# Patient Record
Sex: Female | Born: 1950
Health system: Southern US, Community
[De-identification: ages and names within clinical notes are randomized; demographics above are authoritative.]

## PROBLEM LIST (undated history)

## (undated) DIAGNOSIS — E785 Hyperlipidemia, unspecified: Secondary | ICD-10-CM

## (undated) DIAGNOSIS — M1990 Unspecified osteoarthritis, unspecified site: Secondary | ICD-10-CM

## (undated) DIAGNOSIS — I10 Essential (primary) hypertension: Secondary | ICD-10-CM

## (undated) DIAGNOSIS — T7840XA Allergy, unspecified, initial encounter: Secondary | ICD-10-CM

## (undated) DIAGNOSIS — R011 Cardiac murmur, unspecified: Secondary | ICD-10-CM

## (undated) HISTORY — PX: WISDOM TOOTH EXTRACTION: SHX21

## (undated) HISTORY — DX: Allergy, unspecified, initial encounter: T78.40XA

## (undated) HISTORY — DX: Hyperlipidemia, unspecified: E78.5

## (undated) HISTORY — DX: Essential (primary) hypertension: I10

## (undated) HISTORY — DX: Unspecified osteoarthritis, unspecified site: M19.90

---

## 1998-11-30 ENCOUNTER — Other Ambulatory Visit: Admission: RE | Admit: 1998-11-30 | Discharge: 1998-11-30 | Payer: Self-pay | Admitting: Obstetrics and Gynecology

## 1999-12-07 ENCOUNTER — Other Ambulatory Visit: Admission: RE | Admit: 1999-12-07 | Discharge: 1999-12-07 | Payer: Self-pay | Admitting: *Deleted

## 1999-12-26 ENCOUNTER — Encounter: Payer: Self-pay | Admitting: *Deleted

## 1999-12-26 ENCOUNTER — Encounter: Admission: RE | Admit: 1999-12-26 | Discharge: 1999-12-26 | Payer: Self-pay | Admitting: *Deleted

## 2000-12-12 ENCOUNTER — Other Ambulatory Visit: Admission: RE | Admit: 2000-12-12 | Discharge: 2000-12-12 | Payer: Self-pay | Admitting: *Deleted

## 2001-01-04 ENCOUNTER — Encounter: Admission: RE | Admit: 2001-01-04 | Discharge: 2001-01-04 | Payer: Self-pay | Admitting: *Deleted

## 2001-01-04 ENCOUNTER — Encounter: Payer: Self-pay | Admitting: *Deleted

## 2001-11-20 ENCOUNTER — Other Ambulatory Visit: Admission: RE | Admit: 2001-11-20 | Discharge: 2001-11-20 | Payer: Self-pay | Admitting: Obstetrics and Gynecology

## 2002-03-07 ENCOUNTER — Encounter: Admission: RE | Admit: 2002-03-07 | Discharge: 2002-03-07 | Payer: Self-pay | Admitting: *Deleted

## 2002-03-07 ENCOUNTER — Encounter: Payer: Self-pay | Admitting: *Deleted

## 2002-11-26 ENCOUNTER — Other Ambulatory Visit: Admission: RE | Admit: 2002-11-26 | Discharge: 2002-11-26 | Payer: Self-pay | Admitting: Obstetrics and Gynecology

## 2003-04-07 ENCOUNTER — Encounter: Admission: RE | Admit: 2003-04-07 | Discharge: 2003-04-07 | Payer: Self-pay | Admitting: *Deleted

## 2004-12-28 ENCOUNTER — Ambulatory Visit (HOSPITAL_COMMUNITY): Admission: RE | Admit: 2004-12-28 | Discharge: 2004-12-28 | Payer: Self-pay | Admitting: Obstetrics and Gynecology

## 2006-08-01 ENCOUNTER — Ambulatory Visit (HOSPITAL_COMMUNITY): Admission: RE | Admit: 2006-08-01 | Discharge: 2006-08-01 | Payer: Self-pay | Admitting: Obstetrics and Gynecology

## 2007-08-27 ENCOUNTER — Ambulatory Visit (HOSPITAL_COMMUNITY): Admission: RE | Admit: 2007-08-27 | Discharge: 2007-08-27 | Payer: Self-pay | Admitting: Obstetrics and Gynecology

## 2007-11-20 LAB — HM COLONOSCOPY

## 2009-03-09 ENCOUNTER — Ambulatory Visit (HOSPITAL_COMMUNITY): Admission: RE | Admit: 2009-03-09 | Discharge: 2009-03-09 | Payer: Self-pay | Admitting: Obstetrics and Gynecology

## 2010-03-15 ENCOUNTER — Ambulatory Visit (HOSPITAL_COMMUNITY): Admission: RE | Admit: 2010-03-15 | Discharge: 2010-03-15 | Payer: Self-pay | Admitting: Obstetrics and Gynecology

## 2010-06-05 ENCOUNTER — Encounter: Payer: Self-pay | Admitting: Obstetrics and Gynecology

## 2010-08-11 LAB — HM PAP SMEAR

## 2010-12-15 ENCOUNTER — Encounter: Payer: Self-pay | Admitting: Family Medicine

## 2010-12-15 DIAGNOSIS — E785 Hyperlipidemia, unspecified: Secondary | ICD-10-CM | POA: Insufficient documentation

## 2012-01-18 ENCOUNTER — Other Ambulatory Visit (HOSPITAL_COMMUNITY): Payer: Self-pay | Admitting: Obstetrics and Gynecology

## 2012-01-18 DIAGNOSIS — Z1231 Encounter for screening mammogram for malignant neoplasm of breast: Secondary | ICD-10-CM

## 2012-01-30 ENCOUNTER — Ambulatory Visit (HOSPITAL_COMMUNITY)
Admission: RE | Admit: 2012-01-30 | Discharge: 2012-01-30 | Disposition: A | Discharge status: Home / Self Care | Payer: BC Managed Care – PPO | Source: Ambulatory Visit | Attending: Obstetrics and Gynecology | Admitting: Obstetrics and Gynecology

## 2012-01-30 DIAGNOSIS — Z1231 Encounter for screening mammogram for malignant neoplasm of breast: Secondary | ICD-10-CM | POA: Insufficient documentation

## 2012-08-30 ENCOUNTER — Other Ambulatory Visit: Payer: Self-pay | Admitting: Family Medicine

## 2012-09-02 NOTE — Telephone Encounter (Signed)
Ok to refill x 3 

## 2012-09-02 NOTE — Telephone Encounter (Signed)
?   OK to Refill  

## 2012-09-02 NOTE — Telephone Encounter (Signed)
Rx Refilled  

## 2012-12-08 ENCOUNTER — Other Ambulatory Visit: Payer: Self-pay | Admitting: Family Medicine

## 2013-03-13 ENCOUNTER — Other Ambulatory Visit: Payer: Self-pay | Admitting: Family Medicine

## 2013-03-13 NOTE — Telephone Encounter (Signed)
Ok x 5 

## 2013-03-13 NOTE — Telephone Encounter (Signed)
Ok to refill 

## 2013-03-26 ENCOUNTER — Ambulatory Visit (INDEPENDENT_AMBULATORY_CARE_PROVIDER_SITE_OTHER): Payer: BC Managed Care – PPO

## 2013-03-26 VITALS — BP 142/84 | HR 77 | Resp 24 | Ht 64.5 in | Wt 148.0 lb

## 2013-03-26 DIAGNOSIS — M21619 Bunion of unspecified foot: Secondary | ICD-10-CM

## 2013-03-26 DIAGNOSIS — M2010 Hallux valgus (acquired), unspecified foot: Secondary | ICD-10-CM

## 2013-03-26 DIAGNOSIS — M2040 Other hammer toe(s) (acquired), unspecified foot: Secondary | ICD-10-CM

## 2013-03-26 DIAGNOSIS — M1990 Unspecified osteoarthritis, unspecified site: Secondary | ICD-10-CM

## 2013-03-26 NOTE — Patient Instructions (Signed)
Bunion You have a bunion deformity of the feet. This is more common in women. It tends to be an inherited problem. Symptoms can include pain, swelling, and deformity around the great toe. Numbness and tingling may also be present. Your symptoms are often worsened by wearing shoes that cause pressure on the bunion. Changing the type of shoes you wear helps reduce symptoms. A wide shoe decreases pressure on the bunion. An arch support may be used if you have flat feet. Avoid shoes with heels higher than two inches. This puts more pressure on the bunion. X-rays may be helpful in evaluating the severity of the problem. Other foot problems often seen with bunions include corns, calluses, and hammer toes. If the deformity or pain is severe, surgical treatment may be necessary. Keep off your painful foot as much as possible until the pain is relieved. Call your caregiver if your symptoms are worse.  SEEK IMMEDIATE MEDICAL CARE IF:  You have increased redness, pain, swelling, or other symptoms of infection. Document Released: 05/01/2005 Document Revised: 07/24/2011 Document Reviewed: 10/29/2006 Exit[employer] Patient Information 2014 [employer]. Osteoarthritis Osteoarthritis is the most common form of arthritis. It is redness, soreness, and swelling (inflammation) affecting the cartilage. Cartilage acts as a cushion, covering the ends of bones where they meet to form a joint. CAUSES  Over time, the cartilage begins to wear away. This causes bone to rub on bone. This produces pain and stiffness in the affected joints. Factors that contribute to this problem are:  Excessive body weight.  Age.  Overuse of joints. SYMPTOMS   People with osteoarthritis usually experience joint pain, swelling, or stiffness.  Over time, the joint may lose its normal shape.  Small deposits of bone (osteophytes) may grow on the edges of the joint.  Bits of bone or cartilage can break off and float inside the joint space. This  may cause more pain and damage.  Osteoarthritis can lead to depression, anxiety, feelings of helplessness, and limitations on daily activities. The most commonly affected joints are in the:  Ends of the fingers.  Thumbs.  Neck.  Lower back.  Knees.  Hips. DIAGNOSIS  Diagnosis is mostly based on your symptoms and exam. Tests may be helpful, including:  X-rays of the affected joint.  A computerized magnetic scan (MRI).  Blood tests to rule out other types of arthritis.  Joint fluid tests. This involves using a needle to draw fluid from the joint and examining the fluid under a microscope. TREATMENT  Goals of treatment are to control pain, improve joint function, maintain a normal body weight, and maintain a healthy lifestyle. Treatment approaches may include:  A prescribed exercise program with rest and joint relief.  Weight control with nutritional education.  Pain relief techniques such as:  Properly applied heat and cold.  Electric pulses delivered to nerve endings under the skin (transcutaneous electrical nerve stimulation, TENS).  Massage.  Certain supplements. Ask your caregiver before using any supplements, especially in combination with prescribed drugs.  Medicines to control pain, such as:  Acetaminophen.  Nonsteroidal anti-inflammatory drugs (NSAIDs), such as naproxen.  Narcotic or central-acting agents, such as tramadol. This drug carries a risk of addiction and is generally prescribed for short-term use.  Corticosteroids. These can be given orally or as injection. This is a short-term treatment, not recommended for routine use.  Surgery to reposition the bones and relieve pain (osteotomy) or to remove loose pieces of bone and cartilage. Joint replacement may be needed in advanced states   of osteoarthritis. HOME CARE INSTRUCTIONS  Your caregiver can recommend specific types of exercise. These may include:  Strengthening exercises. These are done to  strengthen the muscles that support joints affected by arthritis. They can be performed with weights or with exercise bands to add resistance.  Aerobic activities. These are exercises, such as brisk walking or low-impact aerobics, that get your heart pumping. They can help keep your lungs and circulatory system in shape.  Range-of-motion activities. These keep your joints limber.  Balance and agility exercises. These help you maintain daily living skills. Learning about your condition and being actively involved in your care will help improve the course of your osteoarthritis. SEEK MEDICAL CARE IF:   You feel hot or your skin turns red.  You develop a rash in addition to your joint pain.  You have an oral temperature above 102 F (38.9 C). FOR MORE INFORMATION  National Institute of Arthritis and Musculoskeletal and Skin Diseases: www.niams.nih.gov National Institute on Aging: www.nia.nih.gov American College of Rheumatology: www.rheumatology.org Document Released: 05/01/2005 Document Revised: 07/24/2011 Document Reviewed: 08/12/2009 Exit[employer] Patient Information 2014 [employer].  

## 2013-03-26 NOTE — Progress Notes (Signed)
  Subjective:    Patient ID: Amy Dennis, female    DOB: 08-Aug-1950, 62 y.o.   MRN: [id number] "I want to see what he says about my Bunions."  Foot Pain This is a new problem. The current episode started more than 1 year ago. The problem occurs intermittently. The problem has been gradually worsening (right foot is worst). The symptoms are aggravated by walking (leather shoes, get up on tippy toes). She has tried NSAIDs for the symptoms. The treatment provided moderate relief.      Review of Systems  Constitutional: Negative.   HENT: Positive for sinus pressure.   Eyes: Positive for redness.  Respiratory: Negative.   Cardiovascular: Negative.   Gastrointestinal: Negative.   Endocrine: Negative.   Genitourinary: Negative.   Musculoskeletal: Negative.   Skin: Negative.   Allergic/Immunologic: Negative.   Neurological: Negative.   Hematological: Negative.   Psychiatric/Behavioral: Positive for sleep disturbance.       Objective:   Physical Exam Neurovascular status is intact with pedal pulses palpable DP and PT posterior were for bilateral. Capillary refill time 3 seconds all digits. Skin temperature warm turgor normal no edema rubor pallor or varicosities noted. Neurologically epicritic and proprioceptive sensations intact and symmetric bilateral. Normal plantar response and DTRs are noted. 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 On palpation there is hypertrophy of the second MTP joint with arthrosis of the MTP joints first and second being noted bilateral. X-rays reviewed reveal significant elevated I am angles as well as hallux abductus angle and arthrosis of the first and second MTP joint with asymmetric joint space narrowing and likely cartilage erosions.       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 Patient also has significant hammertoe deformity with degenerative arthritic changes of the second and first MTP joints being noted clinically and radiographically. Patient is a good candidate for surgical intervention however currently caring for her elderly parent. Patient is given literature about bunion and hammertoe correction and will reappoint with the next several months likely early in 2015 for possible surgical consultation involving bunion and hammertoe correction and possibly cheilectomy of the MTP joint second. In the interim patient was given recommendations for wide accommodative shoes with a firm or stable last avoid flimsy shoes, avoid walking barefoot suggested NSAID therapy as needed for pain, over-the-counter pads and cushions may be beneficial as well. Again followup with in the next 2 months for possible surgical consultation  Alvan Dame DPM

## 2013-03-27 ENCOUNTER — Ambulatory Visit (INDEPENDENT_AMBULATORY_CARE_PROVIDER_SITE_OTHER): Payer: BC Managed Care – PPO | Admitting: *Deleted

## 2013-03-27 DIAGNOSIS — Z23 Encounter for immunization: Secondary | ICD-10-CM

## 2013-04-13 ENCOUNTER — Other Ambulatory Visit: Payer: Self-pay | Admitting: Family Medicine

## 2013-04-30 ENCOUNTER — Other Ambulatory Visit (HOSPITAL_COMMUNITY): Payer: Self-pay | Admitting: Obstetrics and Gynecology

## 2013-04-30 DIAGNOSIS — Z1231 Encounter for screening mammogram for malignant neoplasm of breast: Secondary | ICD-10-CM

## 2013-05-16 ENCOUNTER — Ambulatory Visit (HOSPITAL_COMMUNITY)
Admission: RE | Admit: 2013-05-16 | Discharge: 2013-05-16 | Disposition: A | Discharge status: Home / Self Care | Payer: BC Managed Care – PPO | Source: Ambulatory Visit | Attending: Obstetrics and Gynecology | Admitting: Obstetrics and Gynecology

## 2013-05-16 DIAGNOSIS — Z1231 Encounter for screening mammogram for malignant neoplasm of breast: Secondary | ICD-10-CM | POA: Insufficient documentation

## 2013-07-16 ENCOUNTER — Other Ambulatory Visit: Payer: Self-pay | Admitting: Family Medicine

## 2013-07-16 MED ORDER — SIMVASTATIN 40 MG PO TABS: 40.0000 mg | ORAL_TABLET | Freq: Every day | ORAL | Status: DC

## 2013-07-16 NOTE — Telephone Encounter (Signed)
Rx Refilled  

## 2013-08-19 ENCOUNTER — Ambulatory Visit (INDEPENDENT_AMBULATORY_CARE_PROVIDER_SITE_OTHER): Payer: BC Managed Care – PPO | Admitting: Family Medicine

## 2013-08-19 ENCOUNTER — Encounter: Payer: Self-pay | Admitting: Family Medicine

## 2013-08-19 VITALS — BP 130/80 | HR 84 | Temp 97.7°F | Resp 14 | Ht 64.5 in | Wt 152.0 lb

## 2013-08-19 DIAGNOSIS — I10 Essential (primary) hypertension: Secondary | ICD-10-CM

## 2013-08-19 DIAGNOSIS — Z23 Encounter for immunization: Secondary | ICD-10-CM

## 2013-08-19 DIAGNOSIS — E785 Hyperlipidemia, unspecified: Secondary | ICD-10-CM

## 2013-08-19 LAB — LIPID PANEL
CHOL/HDL RATIO: 2.5 ratio
CHOLESTEROL: 179 mg/dL (ref 0–200)
HDL: 71 mg/dL (ref >39)
LDL CALC: 95 mg/dL (ref 0–99)
Triglycerides: 64 mg/dL (ref <150)
VLDL: 13 mg/dL (ref 0–40)

## 2013-08-19 LAB — COMPLETE METABOLIC PANEL WITHOUT GFR
ALT: 12 U/L (ref 0–35)
AST: 17 U/L (ref 0–37)
Albumin: 4.2 g/dL (ref 3.5–5.2)
Alkaline Phosphatase: 51 U/L (ref 39–117)
BUN: 10 mg/dL (ref 6–23)
CO2: 28 meq/L (ref 19–32)
Calcium: 9.2 mg/dL (ref 8.4–10.5)
Chloride: 101 meq/L (ref 96–112)
Creat: 0.66 mg/dL (ref 0.50–1.10)
GFR, Est African American: >89 mL/min
GFR, Est Non African American: >89 mL/min
Glucose, Bld: 102 mg/dL — ABNORMAL HIGH (ref 70–99)
Potassium: 4 meq/L (ref 3.5–5.3)
Sodium: 141 meq/L (ref 135–145)
Total Bilirubin: 0.4 mg/dL (ref 0.2–1.2)
Total Protein: 6.7 g/dL (ref 6.0–8.3)

## 2013-08-19 MED ORDER — HYDROCHLOROTHIAZIDE 25 MG PO TABS: ORAL_TABLET | ORAL | Status: DC

## 2013-08-19 MED ORDER — ZOLPIDEM TARTRATE 10 MG PO TABS: ORAL_TABLET | ORAL | Status: DC

## 2013-08-19 MED ORDER — FLUTICASONE PROPIONATE 50 MCG/ACT NA SUSP: 2.0000 | Freq: Every day | NASAL | Status: DC

## 2013-08-19 NOTE — Progress Notes (Signed)
   Subjective:    Patient ID: Amy Dennis, female    DOB: 03-13-1951, 63 y.o.   MRN: [id number]  HPI Patient's colonoscopy and mammogram are up-to-date. She has a history of high blood pressure on hydrochlorothiazide 25 mg by mouth daily. Blood pressure is well-controlled today 130/80. She denies any chest pain shortness of breath or dyspnea on exertion. She also has a history of hyperlipidemia for which he takes Zocor 40 mg by mouth daily. She denies any myalgias or right upper quadrant pain. She is overdue for a fasting lipid panel. She sees her gynecologist for Pap smears. Her immunizations are up-to-date. She is requesting a refill on Ambien which he takes for insomnia. She has become dependent on the medication and requires 5-10 mg every night to help her sleep. Past Medical History  Diagnosis Date  . Hyperlipidemia   . Allergy   . Hypertension    Current Outpatient Prescriptions on File Prior to Visit  Medication Sig Dispense Refill  . aspirin 81 MG tablet Take 81 mg by mouth daily.        . Calcium Citrate (CITRACAL PO) Take 1 tablet by mouth daily.      . cholecalciferol (VITAMIN D) 1000 UNITS tablet Take 1,000 Units by mouth daily.        . Multiple Vitamins-Minerals (MULTIVITAMIN,TX-MINERALS) tablet Take 1 tablet by mouth daily.        . simvastatin (ZOCOR) 40 MG tablet Take 1 tablet (40 mg total) by mouth at bedtime.  30 tablet  0  . loratadine (CLARITIN) 10 MG tablet Take 10 mg by mouth daily.       No current facility-administered medications on file prior to visit.   No Known Allergies History   Social History  . Marital Status: Married    Spouse Name: N/A    Number of Children: N/A  . Years of Education: N/A   Occupational History  . Not on file.   Social History Main Topics  . Smoking status: Never Smoker   . Smokeless tobacco: Not on file  . Alcohol Use: No  . Drug Use: No  . Sexual Activity: Not on file   Other Topics Concern  . Not on file   Social  History Narrative  . No narrative on file      Review of Systems  All other systems reviewed and are negative.       Objective:   Physical Exam  Vitals reviewed. Neck: Neck supple. No thyromegaly present.  Cardiovascular: Normal rate, regular rhythm, normal heart sounds and intact distal pulses.  Exam reveals no gallop and no friction rub.   No murmur heard. Pulmonary/Chest: Effort normal and breath sounds normal. No respiratory distress. She has no wheezes. She has no rales. She exhibits no tenderness.  Abdominal: Soft. Bowel sounds are normal. She exhibits no distension. There is no tenderness. There is no rebound and no guarding.  Musculoskeletal: She exhibits no edema.  Lymphadenopathy:    She has no cervical adenopathy.          Assessment & Plan:  1. HLD (hyperlipidemia) Check fasting lipid panel. Goal LDL is less than 130 - COMPLETE METABOLIC PANEL WITH GFR - Lipid panel  2. HTN (hypertension) Blood pressures well controlled. Continue current medications at the present dosages. I also refilled her Ambien 10 mg by mouth each bedtime.  3. Need for prophylactic vaccination and inoculation against unspecified single disease Patient received a tetanus vaccine today office.

## 2013-08-21 ENCOUNTER — Other Ambulatory Visit: Payer: Self-pay | Admitting: *Deleted

## 2013-08-21 MED ORDER — SIMVASTATIN 40 MG PO TABS: 40.0000 mg | ORAL_TABLET | Freq: Every day | ORAL | Status: DC

## 2013-08-21 NOTE — Telephone Encounter (Signed)
Per orders noted on labs, medication sent to pharmacy.  

## 2013-08-22 ENCOUNTER — Other Ambulatory Visit: Payer: Self-pay | Admitting: Family Medicine

## 2013-08-22 MED ORDER — SIMVASTATIN 40 MG PO TABS: 40.0000 mg | ORAL_TABLET | Freq: Every day | ORAL | Status: DC

## 2013-08-26 ENCOUNTER — Encounter: Payer: Self-pay | Admitting: Family Medicine

## 2013-09-03 ENCOUNTER — Other Ambulatory Visit (HOSPITAL_COMMUNITY): Payer: Self-pay | Admitting: Obstetrics and Gynecology

## 2013-09-03 DIAGNOSIS — Z780 Asymptomatic menopausal state: Secondary | ICD-10-CM

## 2013-09-22 ENCOUNTER — Other Ambulatory Visit: Payer: Self-pay | Admitting: Family Medicine

## 2013-09-22 NOTE — Telephone Encounter (Signed)
Ok to refill??  Last office visit/ refill 08/19/2013.

## 2013-09-22 NOTE — Telephone Encounter (Signed)
Medication called to pharmacy. 

## 2013-09-22 NOTE — Telephone Encounter (Signed)
ok 

## 2013-11-22 ENCOUNTER — Telehealth: Payer: Self-pay | Admitting: Family Medicine

## 2013-11-22 NOTE — Telephone Encounter (Signed)
Requesting a refill on Ambien - ? OK to Refill  

## 2013-11-24 MED ORDER — ZOLPIDEM TARTRATE 10 MG PO TABS: ORAL_TABLET | ORAL | Status: DC

## 2013-11-24 NOTE — Telephone Encounter (Signed)
ok 

## 2013-11-24 NOTE — Telephone Encounter (Signed)
Med called to pharm 

## 2013-12-02 ENCOUNTER — Ambulatory Visit (HOSPITAL_COMMUNITY)
Admission: RE | Admit: 2013-12-02 | Discharge: 2013-12-02 | Disposition: A | Discharge status: Home / Self Care | Payer: BC Managed Care – PPO | Source: Ambulatory Visit | Attending: Obstetrics and Gynecology | Admitting: Obstetrics and Gynecology

## 2013-12-02 DIAGNOSIS — Z780 Asymptomatic menopausal state: Secondary | ICD-10-CM | POA: Insufficient documentation

## 2013-12-02 DIAGNOSIS — Z13820 Encounter for screening for osteoporosis: Secondary | ICD-10-CM | POA: Insufficient documentation

## 2013-12-03 ENCOUNTER — Encounter: Payer: Self-pay | Admitting: Family Medicine

## 2014-02-12 ENCOUNTER — Ambulatory Visit (INDEPENDENT_AMBULATORY_CARE_PROVIDER_SITE_OTHER): Payer: BC Managed Care – PPO | Admitting: Family Medicine

## 2014-02-12 DIAGNOSIS — Z23 Encounter for immunization: Secondary | ICD-10-CM

## 2014-06-04 ENCOUNTER — Other Ambulatory Visit: Payer: Self-pay | Admitting: Family Medicine

## 2014-06-04 NOTE — Telephone Encounter (Signed)
ok 

## 2014-06-04 NOTE — Telephone Encounter (Signed)
Medication called to pharmacy. 

## 2014-06-04 NOTE — Telephone Encounter (Signed)
Ok to refill??  Last office visit 08/19/2013.  Last refill 11/24/2013, #2 refills.

## 2014-06-07 ENCOUNTER — Other Ambulatory Visit: Payer: Self-pay | Admitting: Family Medicine

## 2014-06-09 ENCOUNTER — Ambulatory Visit (INDEPENDENT_AMBULATORY_CARE_PROVIDER_SITE_OTHER): Payer: BC Managed Care – PPO | Admitting: Family Medicine

## 2014-06-09 ENCOUNTER — Encounter: Payer: Self-pay | Admitting: Family Medicine

## 2014-06-09 VITALS — BP 160/92 | HR 68 | Temp 98.3°F | Resp 14 | Ht 64.5 in | Wt 156.0 lb

## 2014-06-09 DIAGNOSIS — E785 Hyperlipidemia, unspecified: Secondary | ICD-10-CM

## 2014-06-09 DIAGNOSIS — I10 Essential (primary) hypertension: Secondary | ICD-10-CM

## 2014-06-09 DIAGNOSIS — J019 Acute sinusitis, unspecified: Secondary | ICD-10-CM

## 2014-06-09 MED ORDER — HYDROCHLOROTHIAZIDE 25 MG PO TABS: ORAL_TABLET | ORAL | Status: DC

## 2014-06-09 MED ORDER — ZOLPIDEM TARTRATE 10 MG PO TABS: ORAL_TABLET | ORAL | Status: DC

## 2014-06-09 MED ORDER — SIMVASTATIN 40 MG PO TABS: 40.0000 mg | ORAL_TABLET | Freq: Every day | ORAL | Status: DC

## 2014-06-09 MED ORDER — AMOXICILLIN 875 MG PO TABS
875.0000 mg | ORAL_TABLET | Freq: Two times a day (BID) | ORAL | Status: DC
Start: 2014-06-09 — End: 2015-03-02

## 2014-06-09 NOTE — Progress Notes (Signed)
Subjective:    Patient ID: Amy Dennis, female    DOB: 17-Dec-1950, [age] y.o.   MRN: [id number]  HPI Patient's blood pressure today is significantly elevated however the patient has been out of her blood pressure medication now for almost a week. She denies any chest pain shortness of breath or dyspnea on exertion. She is also taking simvastatin 40 mg by mouth daily for hyperlipidemia. She denies any myalgias or right upper quadrant pain. She is overdue for fasting lab work. She is also had frontal sinus pain and pressure for almost 3 weeks. She reports epistaxis and purulent nasal discharge. Denies any fevers or chills. Past Medical History  Diagnosis Date  . Hyperlipidemia   . Allergy   . Hypertension    Past Surgical History  Procedure Laterality Date  . Cesarean section     Current Outpatient Prescriptions on File Prior to Visit  Medication Sig Dispense Refill  . aspirin 81 MG tablet Take 81 mg by mouth daily.      . Calcium Citrate (CITRACAL PO) Take 1 tablet by mouth daily.    . cholecalciferol (VITAMIN D) 1000 UNITS tablet Take 1,000 Units by mouth daily.      . fluticasone (FLONASE) 50 MCG/ACT nasal spray Place 2 sprays into both nostrils daily. (Patient taking differently: Place 2 sprays into both nostrils daily. ) 16 g 11  . loratadine (CLARITIN) 10 MG tablet Take 10 mg by mouth daily.    . Multiple Vitamins-Minerals (MULTIVITAMIN,TX-MINERALS) tablet Take 1 tablet by mouth daily.       No current facility-administered medications on file prior to visit.   No Known Allergies History   Social History  . Marital Status: Married    Spouse Name: N/A    Number of Children: N/A  . Years of Education: N/A   Occupational History  . Not on file.   Social History Main Topics  . Smoking status: Never Smoker   . Smokeless tobacco: Not on file  . Alcohol Use: No  . Drug Use: No  . Sexual Activity: Not on file   Other Topics Concern  . Not on file   Social History  Narrative      Review of Systems  All other systems reviewed and are negative.      Objective:   Physical Exam  Constitutional: She appears well-developed and well-nourished. No distress.  HENT:  Right Ear: External ear normal.  Left Ear: External ear normal.  Nose: Right sinus exhibits maxillary sinus tenderness and frontal sinus tenderness.  Neck: Neck supple.  Cardiovascular: Normal rate, regular rhythm and normal heart sounds.   Pulmonary/Chest: Effort normal and breath sounds normal. No respiratory distress. She has no wheezes. She has no rales.  Abdominal: Soft. Bowel sounds are normal.  Musculoskeletal: She exhibits no edema.  Lymphadenopathy:    She has no cervical adenopathy.  Skin: She is not diaphoretic.  Vitals reviewed.         Assessment & Plan:  Acute rhinosinusitis - Plan: amoxicillin (AMOXIL) 875 MG tablet  Essential hypertension  HLD (hyperlipidemia)  Patient's blood pressures elevated today. I will her to begin hydrochlorothiazide 25 mg by mouth daily and recheck blood pressure in one week. If still elevated at that time, I would add an angiotensin receptor blocker. I would like the patient to return fasting for a CMP as well as a fasting lipid panel. I will treat her sinusitis with amoxicillin 875 mg by mouth twice a day for 10  days

## 2014-08-04 ENCOUNTER — Other Ambulatory Visit: Payer: BC Managed Care – PPO

## 2014-08-04 DIAGNOSIS — I10 Essential (primary) hypertension: Secondary | ICD-10-CM

## 2014-08-04 DIAGNOSIS — E785 Hyperlipidemia, unspecified: Secondary | ICD-10-CM

## 2014-08-04 LAB — CBC WITH DIFFERENTIAL/PLATELET
BASOS ABS: 0.1 10*3/uL (ref 0.0–0.1)
BASOS PCT: 1 % (ref 0–1)
Eosinophils Absolute: 0.3 10*3/uL (ref 0.0–0.7)
Eosinophils Relative: 5 % (ref 0–5)
HCT: 38.3 % (ref 36.0–46.0)
Hemoglobin: 12.7 g/dL (ref 12.0–15.0)
Lymphocytes Relative: 24 % (ref 12–46)
Lymphs Abs: 1.2 10*3/uL (ref 0.7–4.0)
MCH: 28.2 pg (ref 26.0–34.0)
MCHC: 33.2 g/dL (ref 30.0–36.0)
MCV: 84.9 fL (ref 78.0–100.0)
MPV: 12.4 fL (ref 8.6–12.4)
Monocytes Absolute: 0.4 10*3/uL (ref 0.1–1.0)
Monocytes Relative: 8 % (ref 3–12)
NEUTROS PCT: 62 % (ref 43–77)
Neutro Abs: 3.1 10*3/uL (ref 1.7–7.7)
PLATELETS: 183 10*3/uL (ref 150–400)
RBC: 4.51 MIL/uL (ref 3.87–5.11)
RDW: 14.4 % (ref 11.5–15.5)
WBC: 5 10*3/uL (ref 4.0–10.5)

## 2014-08-04 LAB — COMPLETE METABOLIC PANEL WITH GFR
ALT: 14 U/L (ref 0–35)
AST: 20 U/L (ref 0–37)
Albumin: 4.2 g/dL (ref 3.5–5.2)
Alkaline Phosphatase: 51 U/L (ref 39–117)
BUN: 12 mg/dL (ref 6–23)
CALCIUM: 8.8 mg/dL (ref 8.4–10.5)
CHLORIDE: 102 meq/L (ref 96–112)
CO2: 29 mEq/L (ref 19–32)
Creat: 0.76 mg/dL (ref 0.50–1.10)
GFR, Est African American: >89 mL/min
GFR, Est Non African American: 84 mL/min
Glucose, Bld: 98 mg/dL (ref 70–99)
POTASSIUM: 4.2 meq/L (ref 3.5–5.3)
SODIUM: 139 meq/L (ref 135–145)
Total Bilirubin: 0.4 mg/dL (ref 0.2–1.2)
Total Protein: 6.5 g/dL (ref 6.0–8.3)

## 2014-08-04 LAB — LIPID PANEL
CHOL/HDL RATIO: 2.7 ratio
Cholesterol: 185 mg/dL (ref 0–200)
HDL: 68 mg/dL (ref >=46)
LDL CALC: 103 mg/dL — AB (ref 0–99)
Triglycerides: 68 mg/dL (ref <150)
VLDL: 14 mg/dL (ref 0–40)

## 2014-08-06 ENCOUNTER — Encounter: Payer: Self-pay | Admitting: *Deleted

## 2014-09-14 ENCOUNTER — Other Ambulatory Visit: Payer: Self-pay | Admitting: Family Medicine

## 2014-09-15 NOTE — Telephone Encounter (Signed)
Refill appropriate and filled per protocol. 

## 2014-11-05 ENCOUNTER — Other Ambulatory Visit: Payer: Self-pay | Admitting: Family Medicine

## 2015-01-10 ENCOUNTER — Other Ambulatory Visit: Payer: Self-pay | Admitting: Family Medicine

## 2015-01-11 NOTE — Telephone Encounter (Signed)
Ok to refill??  Last office visit/ refill 06/09/2014, #4 refills.

## 2015-01-11 NOTE — Telephone Encounter (Signed)
ok 

## 2015-01-11 NOTE — Telephone Encounter (Signed)
Medication called to pharmacy. 

## 2015-03-02 ENCOUNTER — Encounter: Payer: Self-pay | Admitting: Family Medicine

## 2015-03-02 ENCOUNTER — Ambulatory Visit (INDEPENDENT_AMBULATORY_CARE_PROVIDER_SITE_OTHER): Payer: BC Managed Care – PPO | Admitting: Family Medicine

## 2015-03-02 VITALS — BP 136/72 | HR 68 | Temp 96.2°F | Resp 16 | Wt 153.0 lb

## 2015-03-02 DIAGNOSIS — Z23 Encounter for immunization: Secondary | ICD-10-CM | POA: Diagnosis not present

## 2015-03-02 DIAGNOSIS — J302 Other seasonal allergic rhinitis: Secondary | ICD-10-CM

## 2015-03-02 NOTE — Progress Notes (Signed)
Subjective:    Patient ID: Amy Dennis, female    DOB: [birth year], 64 y.o.   MRN: [id number]  HPI Patient has seasonal allergies. She is currently on Flonase 2 sprays each nostril every morning. She is also taking Claritin 10 mg by mouth daily. Typically this manages her symptoms fairly well. However recently with the seasonal changes, the patient has been having increasing nasal congestion and rhinorrhea in the evenings. She denies any sinus pain she denies any fevers or chills. She denies any headaches. She is due for a flu shot today. She also has numerous small varicose veins on both legs. However she has not been wearing her compression stockings. Past Medical History  Diagnosis Date  . Hyperlipidemia   . Allergy   . Hypertension    Past Surgical History  Procedure Laterality Date  . Cesarean section     Current Outpatient Prescriptions on File Prior to Visit  Medication Sig Dispense Refill  . aspirin 81 MG tablet Take 81 mg by mouth daily.      . Calcium Citrate (CITRACAL PO) Take 1 tablet by mouth daily.    . cholecalciferol (VITAMIN D) 1000 UNITS tablet Take 1,000 Units by mouth daily.      . fluticasone (FLONASE) 50 MCG/ACT nasal spray USE TWO SPRAY(S) IN EACH NOSTRIL ONCE DAILY 16 g 6  . fluticasone (FLONASE) 50 MCG/ACT nasal spray USE TWO SPRAY(S) IN EACH NOSTRIL ONCE DAILY 16 g 11  . hydrochlorothiazide (HYDRODIURIL) 25 MG tablet TAKE ONE TABLET BY MOUTH ONCE DAILY. 30 tablet 11  . loratadine (CLARITIN) 10 MG tablet Take 10 mg by mouth daily.    . Multiple Vitamins-Minerals (MULTIVITAMIN,TX-MINERALS) tablet Take 1 tablet by mouth daily.      . simvastatin (ZOCOR) 40 MG tablet Take 1 tablet (40 mg total) by mouth at bedtime. 90 tablet 4  . zolpidem (AMBIEN) 10 MG tablet TAKE ONE-HALF TO ONE TABLET BY MOUTH AT BEDTIME AS NEEDED 30 tablet 4   No current facility-administered medications on file prior to visit.   No Known Allergies Social History   Social History  .  Marital Status: Married    Spouse Name: N/A  . Number of Children: N/A  . Years of Education: N/A   Occupational History  . Not on file.   Social History Main Topics  . Smoking status: Never Smoker   . Smokeless tobacco: Not on file  . Alcohol Use: No  . Drug Use: No  . Sexual Activity: Not on file   Other Topics Concern  . Not on file   Social History Narrative      Review of Systems  All other systems reviewed and are negative.      Objective:   Physical Exam  Constitutional: She appears well-developed and well-nourished.  HENT:  Right Ear: External ear normal.  Left Ear: External ear normal.  Nose: Mucosal edema and rhinorrhea present. Right sinus exhibits no maxillary sinus tenderness and no frontal sinus tenderness. Left sinus exhibits no maxillary sinus tenderness and no frontal sinus tenderness.  Mouth/Throat: Oropharynx is clear and moist.  Neck: Neck supple.  Cardiovascular: Normal rate, regular rhythm, normal heart sounds and intact distal pulses.   No murmur heard. Pulmonary/Chest: Effort normal and breath sounds normal. No respiratory distress. She has no wheezes. She has no rales.  Lymphadenopathy:    She has no cervical adenopathy.  Vitals reviewed.         Assessment & Plan:  Other seasonal allergic  rhinitis  Discontinue Flonase and replaced with dymista 1 spray each nostril twice daily. Continue Claritin. The patient received her flu shot today. I recommended that she wear her compression stockings every day till manage her chronic venous insufficiency.

## 2015-03-02 NOTE — Addendum Note (Signed)
Addended by: Legrand RamsWILLIS, SANDY B on: 03/02/2015 05:04 PM   Modules accepted: Orders

## 2015-06-11 ENCOUNTER — Other Ambulatory Visit: Payer: Self-pay | Admitting: Family Medicine

## 2015-07-31 ENCOUNTER — Other Ambulatory Visit: Payer: Self-pay | Admitting: Family Medicine

## 2015-12-14 ENCOUNTER — Other Ambulatory Visit: Payer: Self-pay | Admitting: Family Medicine

## 2015-12-15 ENCOUNTER — Other Ambulatory Visit: Payer: Self-pay | Admitting: Family Medicine

## 2015-12-15 NOTE — Telephone Encounter (Signed)
Refill appropriate and filled per protocol. 

## 2015-12-27 ENCOUNTER — Other Ambulatory Visit: Payer: Self-pay | Admitting: Obstetrics and Gynecology

## 2015-12-27 DIAGNOSIS — Z1231 Encounter for screening mammogram for malignant neoplasm of breast: Secondary | ICD-10-CM

## 2016-01-25 ENCOUNTER — Other Ambulatory Visit: Payer: Self-pay | Admitting: Family Medicine

## 2016-02-29 ENCOUNTER — Encounter (INDEPENDENT_AMBULATORY_CARE_PROVIDER_SITE_OTHER): Payer: BC Managed Care – PPO

## 2016-02-29 ENCOUNTER — Ambulatory Visit: Payer: BC Managed Care – PPO

## 2016-02-29 DIAGNOSIS — Z23 Encounter for immunization: Secondary | ICD-10-CM

## 2016-03-08 ENCOUNTER — Other Ambulatory Visit: Payer: Self-pay | Admitting: Family Medicine

## 2016-06-13 ENCOUNTER — Other Ambulatory Visit: Payer: Self-pay | Admitting: Family Medicine

## 2016-06-13 NOTE — Telephone Encounter (Signed)
Rx filled per protocol  

## 2016-07-11 ENCOUNTER — Other Ambulatory Visit: Payer: Self-pay | Admitting: Family Medicine

## 2016-08-09 ENCOUNTER — Other Ambulatory Visit: Payer: Self-pay | Admitting: Family Medicine

## 2016-09-11 ENCOUNTER — Telehealth: Payer: Self-pay | Admitting: Family Medicine

## 2016-09-11 MED ORDER — SIMVASTATIN 40 MG PO TABS: 40.0000 mg | ORAL_TABLET | Freq: Every day | ORAL | 0 refills | Status: DC

## 2016-09-11 NOTE — Telephone Encounter (Signed)
Medication called/sent to requested pharmacy  

## 2016-09-11 NOTE — Telephone Encounter (Signed)
Patient calling to get prescription sent in for her simvastatin, she has appt tomorrow with dr Maris [facility] [place]

## 2016-09-12 ENCOUNTER — Ambulatory Visit (INDEPENDENT_AMBULATORY_CARE_PROVIDER_SITE_OTHER): Payer: Medicare Other | Admitting: Family Medicine

## 2016-09-12 ENCOUNTER — Encounter: Payer: Self-pay | Admitting: Family Medicine

## 2016-09-12 VITALS — BP 136/80 | HR 82 | Temp 97.4°F | Resp 14 | Ht 64.5 in | Wt 157.0 lb

## 2016-09-12 DIAGNOSIS — Z23 Encounter for immunization: Secondary | ICD-10-CM | POA: Diagnosis not present

## 2016-09-12 DIAGNOSIS — I10 Essential (primary) hypertension: Secondary | ICD-10-CM

## 2016-09-12 NOTE — Addendum Note (Signed)
Addended by: Legrand Rams B on: 09/12/2016 04:48 PM   Modules accepted: Orders

## 2016-09-12 NOTE — Progress Notes (Signed)
   Subjective:    Patient ID: Amy Dennis, female    DOB: [birth year], 66 y.o.   MRN: [id number]  HPI Patient has a history of hypertension and hyperlipidemia. She is currently on hydrochlorothiazide 25 mg by mouth daily and simvastatin 40 mg by mouth daily. She denies any chest pain shortness of breath or dyspnea on exertion. She denies any myalgias or right upper quadrant pain. She is overdue for fasting lab work. Past Medical History:  Diagnosis Date  . Allergy   . Hyperlipidemia   . Hypertension    Past Surgical History:  Procedure Laterality Date  . CESAREAN SECTION     Current Outpatient Prescriptions on File Prior to Visit  Medication Sig Dispense Refill  . aspirin 81 MG tablet Take 81 mg by mouth every other day.     . cholecalciferol (VITAMIN D) 1000 UNITS tablet Take 1,000 Units by mouth daily.      . fluticasone (FLONASE) 50 MCG/ACT nasal spray USE TWO SPRAY(S) IN EACH NOSTRIL ONCE DAILY 16 g 11  . hydrochlorothiazide (HYDRODIURIL) 25 MG tablet TAKE ONE TABLET BY MOUTH ONCE DAILY 30 tablet 5  . loratadine (CLARITIN) 10 MG tablet Take 10 mg by mouth daily.    . Multiple Vitamins-Minerals (MULTIVITAMIN,TX-MINERALS) tablet Take 1 tablet by mouth daily.      . simvastatin (ZOCOR) 40 MG tablet TAKE ONE TABLET BY MOUTH AT BEDTIME 90 tablet 0  . Calcium Citrate (CITRACAL PO) Take 1 tablet by mouth daily.    Marland Kitchen zolpidem (AMBIEN) 10 MG tablet TAKE ONE-HALF TO ONE TABLET BY MOUTH AT BEDTIME AS NEEDED (Patient not taking: Reported on 09/12/2016) 30 tablet 4   No current facility-administered medications on file prior to visit.    No Known Allergies Social History   Social History  . Marital status: Married    Spouse name: N/A  . Number of children: N/A  . Years of education: N/A   Occupational History  . Not on file.   Social History Main Topics  . Smoking status: Never Smoker  . Smokeless tobacco: Never Used  . Alcohol use No  . Drug use: No  . Sexual activity: Not on  file   Other Topics Concern  . Not on file   Social History Narrative  . No narrative on file      Review of Systems  All other systems reviewed and are negative.      Objective:   Physical Exam  Constitutional: She appears well-developed and well-nourished.  HENT:  Right Ear: External ear normal.  Left Ear: External ear normal.  Mouth/Throat: Oropharynx is clear and moist.  Neck: Neck supple.  Cardiovascular: Normal rate, regular rhythm, normal heart sounds and intact distal pulses.   No murmur heard. Pulmonary/Chest: Effort normal and breath sounds normal. No respiratory distress. She has no wheezes. She has no rales.  Lymphadenopathy:    She has no cervical adenopathy.  Vitals reviewed.         Assessment & Plan:  Benign essential HTN - Plan: CBC with Differential/Platelet, COMPLETE METABOLIC PANEL WITH GFR, Lipid panel   Physical exam today is unremarkable. Blood pressure is within normal range. Return fasting for a CBC, CMP, fasting lipid panel. Goal LDL cholesterol is less than 100. Patient received Pneumovax 23 today in clinic

## 2016-09-13 ENCOUNTER — Other Ambulatory Visit: Payer: Medicare Other

## 2016-09-13 LAB — LIPID PANEL
CHOL/HDL RATIO: 2.4 ratio (ref <5.0)
Cholesterol: 169 mg/dL (ref <200)
HDL: 70 mg/dL (ref >50)
LDL Cholesterol: 85 mg/dL (ref <100)
TRIGLYCERIDES: 68 mg/dL (ref <150)
VLDL: 14 mg/dL (ref <30)

## 2016-09-13 LAB — CBC WITH DIFFERENTIAL/PLATELET
BASOS ABS: 0 {cells}/uL (ref 0–200)
Basophils Relative: 0 %
EOS PCT: 3 %
Eosinophils Absolute: 174 cells/uL (ref 15–500)
HCT: 38.1 % (ref 35.0–45.0)
Hemoglobin: 12.6 g/dL (ref 12.0–15.0)
Lymphocytes Relative: 18 %
Lymphs Abs: 1044 cells/uL (ref 850–3900)
MCH: 28.3 pg (ref 27.0–33.0)
MCHC: 33.1 g/dL (ref 32.0–36.0)
MCV: 85.6 fL (ref 80.0–100.0)
MONOS PCT: 6 %
MPV: 11.8 fL (ref 7.5–12.5)
Monocytes Absolute: 348 cells/uL (ref 200–950)
Neutro Abs: 4234 cells/uL (ref 1500–7800)
Neutrophils Relative %: 73 %
PLATELETS: 178 10*3/uL (ref 140–400)
RBC: 4.45 MIL/uL (ref 3.80–5.10)
RDW: 14.6 % (ref 11.0–15.0)
WBC: 5.8 10*3/uL (ref 3.8–10.8)

## 2016-09-13 LAB — COMPLETE METABOLIC PANEL WITH GFR
ALBUMIN: 3.9 g/dL (ref 3.6–5.1)
ALK PHOS: 55 U/L (ref 33–130)
ALT: 15 U/L (ref 6–29)
AST: 19 U/L (ref 10–35)
BUN: 13 mg/dL (ref 7–25)
CO2: 26 mmol/L (ref 20–31)
Calcium: 8.7 mg/dL (ref 8.6–10.4)
Chloride: 104 mmol/L (ref 98–110)
Creat: 0.81 mg/dL (ref 0.50–0.99)
GFR, EST AFRICAN AMERICAN: 88 mL/min (ref >=60)
GFR, EST NON AFRICAN AMERICAN: 76 mL/min (ref >=60)
GLUCOSE: 99 mg/dL (ref 70–99)
POTASSIUM: 4 mmol/L (ref 3.5–5.3)
SODIUM: 141 mmol/L (ref 135–146)
Total Bilirubin: 0.4 mg/dL (ref 0.2–1.2)
Total Protein: 6.7 g/dL (ref 6.1–8.1)

## 2016-09-14 ENCOUNTER — Encounter: Payer: Self-pay | Admitting: Family Medicine

## 2016-09-22 ENCOUNTER — Other Ambulatory Visit: Payer: Self-pay | Admitting: Family Medicine

## 2016-10-10 ENCOUNTER — Ambulatory Visit (INDEPENDENT_AMBULATORY_CARE_PROVIDER_SITE_OTHER): Payer: Medicare Other | Admitting: Family Medicine

## 2016-10-10 ENCOUNTER — Other Ambulatory Visit: Payer: Self-pay | Admitting: Family Medicine

## 2016-10-10 ENCOUNTER — Encounter: Payer: Self-pay | Admitting: Family Medicine

## 2016-10-10 VITALS — BP 144/90 | HR 74 | Temp 98.6°F | Resp 16 | Ht 64.5 in | Wt 158.0 lb

## 2016-10-10 DIAGNOSIS — Z0000 Encounter for general adult medical examination without abnormal findings: Secondary | ICD-10-CM | POA: Diagnosis not present

## 2016-10-10 DIAGNOSIS — I10 Essential (primary) hypertension: Secondary | ICD-10-CM | POA: Diagnosis not present

## 2016-10-10 NOTE — Progress Notes (Signed)
Subjective:    Patient ID: Amy Dennis, female    DOB: 1951-03-30, 66 y.o.   MRN: [id number]  HPI Here for CPE.  Last bone density test was in 2015. Her T score was lowest at her right hip where it was -0.9 but still considered normal. Last mammogram was in 2017 as well.  Last colonoscopy I have on record was 2009 and therefore is due to be repeated next year.  Immunization History  Administered Date(s) Administered  . Influenza Whole 02/13/2008  . Influenza,inj,Quad PF,36+ Mos 03/27/2013, 02/12/2014, 03/02/2015, 02/29/2016  . Pneumococcal Polysaccharide-23 09/12/2016  . Tdap 08/19/2013  . Zoster 07/11/2012   Past Medical History:  Diagnosis Date  . Allergy   . Hyperlipidemia   . Hypertension    Past Surgical History:  Procedure Laterality Date  . CESAREAN SECTION     Current Outpatient Prescriptions on File Prior to Visit  Medication Sig Dispense Refill  . aspirin 81 MG tablet Take 81 mg by mouth every other day.     . Calcium Citrate (CITRACAL PO) Take 1 tablet by mouth daily.    . cholecalciferol (VITAMIN D) 1000 UNITS tablet Take 1,000 Units by mouth daily.      . fluticasone (FLONASE) 50 MCG/ACT nasal spray USE TWO SPRAY(S) IN EACH NOSTRIL ONCE DAILY 16 g 11  . hydrochlorothiazide (HYDRODIURIL) 25 MG tablet TAKE ONE TABLET BY MOUTH ONCE DAILY 30 tablet 5  . loratadine (CLARITIN) 10 MG tablet Take 10 mg by mouth daily.    . Multiple Vitamins-Minerals (MULTIVITAMIN,TX-MINERALS) tablet Take 1 tablet by mouth daily.      . simvastatin (ZOCOR) 40 MG tablet TAKE ONE TABLET BY MOUTH AT BEDTIME 90 tablet 0   No current facility-administered medications on file prior to visit.    No Known Allergies Social History   Social History  . Marital status: Married    Spouse name: N/A  . Number of children: N/A  . Years of education: N/A   Occupational History  . Not on file.   Social History Main Topics  . Smoking status: Never Smoker  . Smokeless tobacco: Never Used  .  Alcohol use No  . Drug use: No  . Sexual activity: Not on file   Other Topics Concern  . Not on file   Social History Narrative  . No narrative on file   Family History  Problem Relation Age of Onset  . Heart disease Mother   . Hypertension Mother   . Arthritis Mother   . Heart disease Father       Review of Systems  All other systems reviewed and are negative.      Objective:   Physical Exam  Constitutional: She is oriented to person, place, and time. She appears well-developed and well-nourished. No distress.  HENT:  Head: Normocephalic and atraumatic.  Right Ear: External ear normal.  Left Ear: External ear normal.  Nose: Nose normal.  Mouth/Throat: Oropharynx is clear and moist. No oropharyngeal exudate.  Eyes: Conjunctivae and EOM are normal. Pupils are equal, round, and reactive to light. Right eye exhibits no discharge. Left eye exhibits no discharge. No scleral icterus.  Neck: Normal range of motion. Neck supple. No JVD present. No tracheal deviation present. No thyromegaly present.  Cardiovascular: Normal rate, regular rhythm, normal heart sounds and intact distal pulses.  Exam reveals no gallop and no friction rub.   No murmur heard. Pulmonary/Chest: Effort normal and breath sounds normal. No stridor. No respiratory distress. She  has no wheezes. She has no rales. She exhibits no tenderness.  Abdominal: Soft. Bowel sounds are normal. She exhibits no distension and no mass. There is no tenderness. There is no rebound and no guarding.  Musculoskeletal: Normal range of motion. She exhibits no edema, tenderness or deformity.  Lymphadenopathy:    She has no cervical adenopathy.  Neurological: She is alert and oriented to person, place, and time. She has normal reflexes. She displays normal reflexes. No cranial nerve deficit. She exhibits normal muscle tone. Coordination normal.  Skin: Skin is warm. No rash noted. She is not diaphoretic. No erythema. No pallor.    Psychiatric: She has a normal mood and affect. Her behavior is normal. Judgment and thought content normal.  Vitals reviewed.         Assessment & Plan:  General medical exam  Benign essential HTN  Physical exam is completely normal. Mammogram is up-to-date was performed in November. Pap smear was performed in November. Colonoscopy is due next year. Patient will perform stool cards 3. Immunizations are up-to-date. Office Visit on 09/12/2016  Component Date Value Ref Range Status  . WBC 09/13/2016 5.8  3.8 - 10.8 K/uL Final  . RBC 09/13/2016 4.45  3.80 - 5.10 MIL/uL Final  . Hemoglobin 09/13/2016 12.6  12.0 - 15.0 g/dL Final  . HCT 09/13/2016 38.1  35.0 - 45.0 % Final  . MCV 09/13/2016 85.6  80.0 - 100.0 fL Final  . MCH 09/13/2016 28.3  27.0 - 33.0 pg Final  . MCHC 09/13/2016 33.1  32.0 - 36.0 g/dL Final  . RDW 09/13/2016 14.6  11.0 - 15.0 % Final  . Platelets 09/13/2016 178  140 - 400 K/uL Final  . MPV 09/13/2016 11.8  7.5 - 12.5 fL Final  . Neutro Abs 09/13/2016 4234  1,500 - 7,800 cells/uL Final  . Lymphs Abs 09/13/2016 1044  850 - 3,900 cells/uL Final  . Monocytes Absolute 09/13/2016 348  200 - 950 cells/uL Final  . Eosinophils Absolute 09/13/2016 174  15 - 500 cells/uL Final  . Basophils Absolute 09/13/2016 0  0 - 200 cells/uL Final  . Neutrophils Relative % 09/13/2016 73  % Final  . Lymphocytes Relative 09/13/2016 18  % Final  . Monocytes Relative 09/13/2016 6  % Final  . Eosinophils Relative 09/13/2016 3  % Final  . Basophils Relative 09/13/2016 0  % Final  . Smear Review 09/13/2016 Criteria for review not met   Final  . Sodium 09/13/2016 141  135 - 146 mmol/L Final  . Potassium 09/13/2016 4.0  3.5 - 5.3 mmol/L Final  . Chloride 09/13/2016 104  98 - 110 mmol/L Final  . CO2 09/13/2016 26  20 - 31 mmol/L Final  . Glucose, Bld 09/13/2016 99  70 - 99 mg/dL Final  . BUN 09/13/2016 13  7 - 25 mg/dL Final  . Creat 09/13/2016 0.81  0.50 - 0.99 mg/dL Final   Comment:    For patients > or = 66 years of age: The upper reference limit for Creatinine is approximately 13% higher for people identified as African-American.     . Total Bilirubin 09/13/2016 0.4  0.2 - 1.2 mg/dL Final  . Alkaline Phosphatase 09/13/2016 55  33 - 130 U/L Final  . AST 09/13/2016 19  10 - 35 U/L Final  . ALT 09/13/2016 15  6 - 29 U/L Final  . Total Protein 09/13/2016 6.7  6.1 - 8.1 g/dL Final  . Albumin 09/13/2016 3.9  3.6 - 5.1 g/dL  Final  . Calcium 09/13/2016 8.7  8.6 - 10.4 mg/dL Final  . GFR, Est African American 09/13/2016 88  >=60 mL/min Final  . GFR, Est Non African American 09/13/2016 76  >=60 mL/min Final  . Cholesterol 09/13/2016 169  <200 mg/dL Final  . Triglycerides 09/13/2016 68  <150 mg/dL Final  . HDL 09/13/2016 70  >50 mg/dL Final  . Total CHOL/HDL Ratio 09/13/2016 2.4  <5.0 Ratio Final  . VLDL 09/13/2016 14  <30 mg/dL Final  . LDL Cholesterol 09/13/2016 85  <100 mg/dL Final

## 2016-11-16 NOTE — Progress Notes (Signed)
This encounter was created in error - please disregard.

## 2017-01-08 ENCOUNTER — Other Ambulatory Visit: Payer: Self-pay | Admitting: Family Medicine

## 2017-01-29 LAB — HM PAP SMEAR: HM Pap smear: NEGATIVE

## 2017-02-28 ENCOUNTER — Ambulatory Visit (INDEPENDENT_AMBULATORY_CARE_PROVIDER_SITE_OTHER): Payer: Medicare Other | Admitting: Family Medicine

## 2017-02-28 DIAGNOSIS — Z23 Encounter for immunization: Secondary | ICD-10-CM | POA: Diagnosis not present

## 2017-03-08 ENCOUNTER — Other Ambulatory Visit: Payer: Self-pay | Admitting: Family Medicine

## 2017-04-18 ENCOUNTER — Other Ambulatory Visit: Payer: Self-pay | Admitting: Family Medicine

## 2017-04-19 ENCOUNTER — Other Ambulatory Visit: Payer: Self-pay | Admitting: Family Medicine

## 2017-10-18 ENCOUNTER — Other Ambulatory Visit: Payer: Self-pay | Admitting: Family Medicine

## 2017-12-03 ENCOUNTER — Other Ambulatory Visit: Payer: Self-pay | Admitting: Family Medicine

## 2018-02-12 ENCOUNTER — Encounter: Payer: Self-pay | Admitting: Family Medicine

## 2018-02-12 LAB — HM COLONOSCOPY

## 2018-02-18 ENCOUNTER — Encounter: Payer: Self-pay | Admitting: *Deleted

## 2018-03-12 ENCOUNTER — Ambulatory Visit (INDEPENDENT_AMBULATORY_CARE_PROVIDER_SITE_OTHER): Payer: Medicare Other

## 2018-03-12 DIAGNOSIS — Z23 Encounter for immunization: Secondary | ICD-10-CM

## 2018-03-12 NOTE — Progress Notes (Signed)
Patient came in today to receive her annual flu shot. Fluarix was given in her right deltoid. She tolerated well. VIS given to patient.

## 2018-03-26 ENCOUNTER — Other Ambulatory Visit: Payer: Self-pay | Admitting: Family Medicine

## 2018-04-09 ENCOUNTER — Encounter: Payer: Medicare Other | Admitting: Family Medicine

## 2018-04-25 ENCOUNTER — Encounter: Payer: Medicare Other | Admitting: Family Medicine

## 2018-04-25 ENCOUNTER — Other Ambulatory Visit: Payer: Self-pay | Admitting: Family Medicine

## 2018-04-29 ENCOUNTER — Ambulatory Visit (INDEPENDENT_AMBULATORY_CARE_PROVIDER_SITE_OTHER): Payer: Medicare Other | Admitting: Family Medicine

## 2018-04-29 ENCOUNTER — Encounter: Payer: Self-pay | Admitting: Family Medicine

## 2018-04-29 ENCOUNTER — Other Ambulatory Visit: Payer: Medicare Other

## 2018-04-29 VITALS — BP 160/94 | HR 72 | Temp 97.8°F | Resp 16 | Ht 64.5 in | Wt 162.0 lb

## 2018-04-29 DIAGNOSIS — I10 Essential (primary) hypertension: Secondary | ICD-10-CM | POA: Diagnosis not present

## 2018-04-29 DIAGNOSIS — Z23 Encounter for immunization: Secondary | ICD-10-CM

## 2018-04-29 NOTE — Progress Notes (Signed)
Subjective:    Patient ID: Amy Dennis, female    DOB: December 10, 1950, 67 y.o.   MRN: [id number]  HPI Patient has not been seen in more than a year and a half.  She is out of her blood pressure medication.  Her blood pressure today is elevated at 160/94.  She denies any chest pain shortness of breath or dyspnea on exertion.  She sees a gynecologist who performs her Pap smear.  She has a mammogram in October that was normal.  Her last bone density test was in 2015.  Her gynecologist is told the patient that they will perform another bone density test next year.  Her colonoscopy was performed earlier this year and was normal except for a polyp.  GI has recommended a repeat colonoscopy in 10 years.  Patient denies any falls.  She denies any depression.  She denies any memory loss. Immunization History  Administered Date(s) Administered  . Influenza Whole 02/13/2008  . Influenza, High Dose Seasonal PF 02/28/2017  . Influenza,inj,Quad PF,6+ Mos 03/27/2013, 02/12/2014, 03/02/2015, 02/29/2016, 03/12/2018  . Pneumococcal Conjugate-13 04/29/2018  . Pneumococcal Polysaccharide-23 09/12/2016  . Tdap 08/19/2013  . Zoster 07/11/2012   Past Medical History:  Diagnosis Date  . Allergy   . Hyperlipidemia   . Hypertension    Past Surgical History:  Procedure Laterality Date  . CESAREAN SECTION     Current Outpatient Medications on File Prior to Visit  Medication Sig Dispense Refill  . aspirin 81 MG tablet Take 81 mg by mouth every other day.     Marland Kitchen CALCIUM-VITAMIN D PO Take by mouth.    . fluticasone (FLONASE) 50 MCG/ACT nasal spray USE 2 SPRAYS IN EACH NOSTRIL ONCE DAILY 45 g 6  . hydrochlorothiazide (HYDRODIURIL) 25 MG tablet TAKE 1 TABLET BY MOUTH ONCE DAILY 30 tablet 5  . loratadine (CLARITIN) 10 MG tablet Take 10 mg by mouth daily.    . Multiple Vitamins-Minerals (MULTIVITAMIN,TX-MINERALS) tablet Take 1 tablet by mouth daily.      . simvastatin (ZOCOR) 40 MG tablet TAKE ONE TABLET BY MOUTH AT  BEDTIME 90 tablet 0   No current facility-administered medications on file prior to visit.    No Known Allergies Social History   Socioeconomic History  . Marital status: Married    Spouse name: Not on file  . Number of children: Not on file  . Years of education: Not on file  . Highest education level: Not on file  Occupational History  . Not on file  Social Needs  . Financial resource strain: Not on file  . Food insecurity:    Worry: Not on file    Inability: Not on file  . Transportation needs:    Medical: Not on file    Non-medical: Not on file  Tobacco Use  . Smoking status: Never Smoker  . Smokeless tobacco: Never Used  Substance and Sexual Activity  . Alcohol use: No  . Drug use: No  . Sexual activity: Not on file  Lifestyle  . Physical activity:    Days per week: Not on file    Minutes per session: Not on file  . Stress: Not on file  Relationships  . Social connections:    Talks on phone: Not on file    Gets together: Not on file    Attends religious service: Not on file    Active member of club or organization: Not on file    Attends meetings of clubs or organizations: Not  on file    Relationship status: Not on file  . Intimate partner violence:    Fear of current or ex partner: Not on file    Emotionally abused: Not on file    Physically abused: Not on file    Forced sexual activity: Not on file  Other Topics Concern  . Not on file  Social History Narrative  . Not on file   Family History  Problem Relation Age of Onset  . Heart disease Mother   . Hypertension Mother   . Arthritis Mother   . Heart disease Father       Review of Systems  All other systems reviewed and are negative.      Objective:   Physical Exam  Constitutional: She is oriented to person, place, and time. She appears well-developed and well-nourished. No distress.  HENT:  Head: Normocephalic and atraumatic.  Right Ear: External ear normal.  Left Ear: External ear  normal.  Nose: Nose normal.  Mouth/Throat: Oropharynx is clear and moist. No oropharyngeal exudate.  Eyes: Pupils are equal, round, and reactive to light. Conjunctivae and EOM are normal. Right eye exhibits no discharge. Left eye exhibits no discharge. No scleral icterus.  Neck: Normal range of motion. Neck supple. No JVD present. No tracheal deviation present. No thyromegaly present.  Cardiovascular: Normal rate, regular rhythm, normal heart sounds and intact distal pulses. Exam reveals no gallop and no friction rub.  No murmur heard. Pulmonary/Chest: Effort normal and breath sounds normal. No stridor. No respiratory distress. She has no wheezes. She has no rales. She exhibits no tenderness.  Abdominal: Soft. Bowel sounds are normal. She exhibits no distension and no mass. There is no abdominal tenderness. There is no rebound and no guarding.  Musculoskeletal: Normal range of motion.        General: No tenderness, deformity or edema.  Lymphadenopathy:    She has no cervical adenopathy.  Neurological: She is alert and oriented to person, place, and time. She has normal reflexes. No cranial nerve deficit. She exhibits normal muscle tone. Coordination normal.  Skin: Skin is warm. No rash noted. She is not diaphoretic. No erythema. No pallor.  Psychiatric: She has a normal mood and affect. Her behavior is normal. Judgment and thought content normal.  Vitals reviewed.         Assessment & Plan:  Benign essential HTN - Plan: CBC with Differential/Platelet, COMPLETE METABOLIC PANEL WITH GFR, Lipid panel  Need for prophylactic vaccination against Streptococcus pneumoniae (pneumococcus) - Plan: Pneumococcal conjugate vaccine 13-valent IM Patient see Prevnar 13 today.  Colonoscopy is up-to-date.  Mammogram is up-to-date through GYN.  Bone density will be scheduled next year.  Strongly encouraged the patient to start hydrochlorothiazide immediately and then recheck blood pressure in 2 weeks.  Return  fasting for a CBC, CMP, fasting lipid panel.  The remainder of her exam and review of systems is normal

## 2018-04-30 ENCOUNTER — Other Ambulatory Visit: Payer: Medicare Other

## 2018-05-01 ENCOUNTER — Other Ambulatory Visit: Payer: Medicare Other

## 2018-05-02 LAB — COMPLETE METABOLIC PANEL WITH GFR
AG Ratio: 1.4 (calc) (ref 1.0–2.5)
ALT: 12 U/L (ref 6–29)
AST: 16 U/L (ref 10–35)
Albumin: 4.2 g/dL (ref 3.6–5.1)
Alkaline phosphatase (APISO): 63 U/L (ref 33–130)
BILIRUBIN TOTAL: 0.4 mg/dL (ref 0.2–1.2)
BUN: 14 mg/dL (ref 7–25)
CALCIUM: 9.1 mg/dL (ref 8.6–10.4)
CHLORIDE: 102 mmol/L (ref 98–110)
CO2: 27 mmol/L (ref 20–32)
Creat: 0.84 mg/dL (ref 0.50–0.99)
GFR, EST AFRICAN AMERICAN: 83 mL/min/{1.73_m2} (ref >=60)
GFR, EST NON AFRICAN AMERICAN: 72 mL/min/{1.73_m2} (ref >=60)
Globulin: 2.9 g/dL (calc) (ref 1.9–3.7)
Glucose, Bld: 100 mg/dL — ABNORMAL HIGH (ref 65–99)
Potassium: 4.2 mmol/L (ref 3.5–5.3)
Sodium: 139 mmol/L (ref 135–146)
TOTAL PROTEIN: 7.1 g/dL (ref 6.1–8.1)

## 2018-05-02 LAB — CBC WITH DIFFERENTIAL/PLATELET
Absolute Monocytes: 441 cells/uL (ref 200–950)
BASOS PCT: 0.7 %
Basophils Absolute: 41 cells/uL (ref 0–200)
EOS ABS: 238 {cells}/uL (ref 15–500)
EOS PCT: 4.1 %
HCT: 38.7 % (ref 35.0–45.0)
Hemoglobin: 12.7 g/dL (ref 11.7–15.5)
Lymphs Abs: 1264 cells/uL (ref 850–3900)
MCH: 27.5 pg (ref 27.0–33.0)
MCHC: 32.8 g/dL (ref 32.0–36.0)
MCV: 83.8 fL (ref 80.0–100.0)
MONOS PCT: 7.6 %
MPV: 13 fL — AB (ref 7.5–12.5)
Neutro Abs: 3816 cells/uL (ref 1500–7800)
Neutrophils Relative %: 65.8 %
PLATELETS: 185 10*3/uL (ref 140–400)
RBC: 4.62 10*6/uL (ref 3.80–5.10)
RDW: 13.6 % (ref 11.0–15.0)
TOTAL LYMPHOCYTE: 21.8 %
WBC: 5.8 10*3/uL (ref 3.8–10.8)

## 2018-05-02 LAB — LIPID PANEL
Cholesterol: 180 mg/dL (ref <200)
HDL: 66 mg/dL (ref >50)
LDL CHOLESTEROL (CALC): 95 mg/dL
Non-HDL Cholesterol (Calc): 114 mg/dL (calc) (ref <130)
Total CHOL/HDL Ratio: 2.7 (calc) (ref <5.0)
Triglycerides: 92 mg/dL (ref <150)

## 2018-05-27 ENCOUNTER — Other Ambulatory Visit: Payer: Self-pay | Admitting: Family Medicine

## 2018-06-23 ENCOUNTER — Other Ambulatory Visit: Payer: Self-pay | Admitting: Family Medicine

## 2018-07-02 ENCOUNTER — Other Ambulatory Visit: Payer: Self-pay | Admitting: Family Medicine

## 2018-07-26 ENCOUNTER — Other Ambulatory Visit: Payer: Self-pay | Admitting: Family Medicine

## 2018-07-29 MED ORDER — HYDROCHLOROTHIAZIDE 25 MG PO TABS: 25.0000 mg | ORAL_TABLET | Freq: Every day | ORAL | 0 refills | Status: DC

## 2018-07-29 NOTE — Addendum Note (Signed)
Addended by: Legrand Rams B on: 07/29/2018 07:56 AM   Modules accepted: Orders

## 2018-08-27 ENCOUNTER — Other Ambulatory Visit: Payer: Self-pay | Admitting: *Deleted

## 2018-08-27 MED ORDER — HYDROCHLOROTHIAZIDE 25 MG PO TABS: 25.0000 mg | ORAL_TABLET | Freq: Every day | ORAL | 0 refills | Status: DC

## 2018-10-25 ENCOUNTER — Other Ambulatory Visit: Payer: Self-pay | Admitting: Family Medicine

## 2018-11-25 ENCOUNTER — Other Ambulatory Visit: Payer: Self-pay | Admitting: Family Medicine

## 2018-12-25 ENCOUNTER — Other Ambulatory Visit: Payer: Self-pay | Admitting: Family Medicine

## 2019-01-03 ENCOUNTER — Other Ambulatory Visit: Payer: Self-pay | Admitting: Family Medicine

## 2019-02-06 ENCOUNTER — Other Ambulatory Visit: Payer: Self-pay | Admitting: Family Medicine

## 2019-02-19 ENCOUNTER — Other Ambulatory Visit: Payer: Self-pay | Admitting: Family Medicine

## 2019-02-19 ENCOUNTER — Other Ambulatory Visit: Payer: Self-pay

## 2019-02-19 ENCOUNTER — Other Ambulatory Visit: Payer: Medicare Other

## 2019-02-19 DIAGNOSIS — Z0000 Encounter for general adult medical examination without abnormal findings: Secondary | ICD-10-CM

## 2019-02-20 LAB — LIPID PANEL
Cholesterol: 173 mg/dL (ref <200)
HDL: 66 mg/dL (ref >=50)
LDL Cholesterol (Calc): 92 mg/dL (calc)
Non-HDL Cholesterol (Calc): 107 mg/dL (calc) (ref <130)
Total CHOL/HDL Ratio: 2.6 (calc) (ref <5.0)
Triglycerides: 60 mg/dL (ref <150)

## 2019-02-20 LAB — COMPREHENSIVE METABOLIC PANEL
AG Ratio: 1.6 (calc) (ref 1.0–2.5)
ALT: 14 U/L (ref 6–29)
AST: 19 U/L (ref 10–35)
Albumin: 4.1 g/dL (ref 3.6–5.1)
Alkaline phosphatase (APISO): 53 U/L (ref 37–153)
BUN: 16 mg/dL (ref 7–25)
CO2: 29 mmol/L (ref 20–32)
Calcium: 9 mg/dL (ref 8.6–10.4)
Chloride: 104 mmol/L (ref 98–110)
Creat: 0.68 mg/dL (ref 0.50–0.99)
Globulin: 2.5 g/dL (calc) (ref 1.9–3.7)
Glucose, Bld: 103 mg/dL — ABNORMAL HIGH (ref 65–99)
Potassium: 4.5 mmol/L (ref 3.5–5.3)
Sodium: 141 mmol/L (ref 135–146)
Total Bilirubin: 0.4 mg/dL (ref 0.2–1.2)
Total Protein: 6.6 g/dL (ref 6.1–8.1)

## 2019-02-20 LAB — CBC WITH DIFFERENTIAL/PLATELET
Absolute Monocytes: 398 cells/uL (ref 200–950)
Basophils Absolute: 48 cells/uL (ref 0–200)
Basophils Relative: 0.9 %
Eosinophils Absolute: 217 cells/uL (ref 15–500)
Eosinophils Relative: 4.1 %
HCT: 37.1 % (ref 35.0–45.0)
Hemoglobin: 12.3 g/dL (ref 11.7–15.5)
Lymphs Abs: 1235 cells/uL (ref 850–3900)
MCH: 28.7 pg (ref 27.0–33.0)
MCHC: 33.2 g/dL (ref 32.0–36.0)
MCV: 86.5 fL (ref 80.0–100.0)
MPV: 13.1 fL — ABNORMAL HIGH (ref 7.5–12.5)
Monocytes Relative: 7.5 %
Neutro Abs: 3403 cells/uL (ref 1500–7800)
Neutrophils Relative %: 64.2 %
Platelets: 180 10*3/uL (ref 140–400)
RBC: 4.29 10*6/uL (ref 3.80–5.10)
RDW: 13.5 % (ref 11.0–15.0)
Total Lymphocyte: 23.3 %
WBC: 5.3 10*3/uL (ref 3.8–10.8)

## 2019-02-24 ENCOUNTER — Other Ambulatory Visit: Payer: Self-pay

## 2019-02-25 ENCOUNTER — Ambulatory Visit (INDEPENDENT_AMBULATORY_CARE_PROVIDER_SITE_OTHER): Payer: Medicare Other | Admitting: Family Medicine

## 2019-02-25 VITALS — BP 140/70 | HR 84 | Temp 97.9°F | Resp 18 | Ht 64.5 in | Wt 155.0 lb

## 2019-02-25 DIAGNOSIS — I10 Essential (primary) hypertension: Secondary | ICD-10-CM

## 2019-02-25 DIAGNOSIS — Z23 Encounter for immunization: Secondary | ICD-10-CM | POA: Diagnosis not present

## 2019-02-25 DIAGNOSIS — E7800 Pure hypercholesterolemia, unspecified: Secondary | ICD-10-CM

## 2019-02-25 DIAGNOSIS — Z0000 Encounter for general adult medical examination without abnormal findings: Secondary | ICD-10-CM | POA: Diagnosis not present

## 2019-02-25 NOTE — Progress Notes (Signed)
Subjective:    Patient ID: Amy Dennis, female    DOB: 03-Jul-1950, 68 y.o.   MRN: [id number]  HPI Patient is here today for complete physical exam.  Her last colonoscopy was in October 2019.  Although she did have one polyp, GI recommended repeat colonoscopy in 10 years.  Her last bone density test was in 2015.  She is due to repeat that at this time.  Immunizations are up-to-date except for the flu shot.  Immunization History  Administered Date(s) Administered  . Influenza Whole 02/13/2008  . Influenza, High Dose Seasonal PF 02/28/2017  . Influenza,inj,Quad PF,6+ Mos 03/27/2013, 02/12/2014, 03/02/2015, 02/29/2016, 03/12/2018  . Pneumococcal Conjugate-13 04/29/2018  . Pneumococcal Polysaccharide-23 09/12/2016  . Tdap 08/19/2013  . Zoster 07/11/2012   Appointment on 02/19/2019  Component Date Value Ref Range Status  . WBC 02/19/2019 5.3  3.8 - 10.8 Thousand/uL Final  . RBC 02/19/2019 4.29  3.80 - 5.10 Million/uL Final  . Hemoglobin 02/19/2019 12.3  11.7 - 15.5 g/dL Final  . HCT 95/63/8756 37.1  35.0 - 45.0 % Final  . MCV 02/19/2019 86.5  80.0 - 100.0 fL Final  . MCH 02/19/2019 28.7  27.0 - 33.0 pg Final  . MCHC 02/19/2019 33.2  32.0 - 36.0 g/dL Final  . RDW 43/32/9518 13.5  11.0 - 15.0 % Final  . Platelets 02/19/2019 180  140 - 400 Thousand/uL Final  . MPV 02/19/2019 13.1* 7.5 - 12.5 fL Final  . Neutro Abs 02/19/2019 3,403  1,500 - 7,800 cells/uL Final  . Lymphs Abs 02/19/2019 1,235  850 - 3,900 cells/uL Final  . Absolute Monocytes 02/19/2019 398  200 - 950 cells/uL Final  . Eosinophils Absolute 02/19/2019 217  15 - 500 cells/uL Final  . Basophils Absolute 02/19/2019 48  0 - 200 cells/uL Final  . Neutrophils Relative % 02/19/2019 64.2  % Final  . Total Lymphocyte 02/19/2019 23.3  % Final  . Monocytes Relative 02/19/2019 7.5  % Final  . Eosinophils Relative 02/19/2019 4.1  % Final  . Basophils Relative 02/19/2019 0.9  % Final  . Glucose, Bld 02/19/2019 103* 65 - 99 mg/dL  Final   Comment: .            Fasting reference interval . For someone without known diabetes, a glucose value between 100 and 125 mg/dL is consistent with prediabetes and should be confirmed with a follow-up test. .   . BUN 02/19/2019 16  7 - 25 mg/dL Final  . Creat 84/16/6063 0.68  0.50 - 0.99 mg/dL Final   Comment: For patients >83 years of age, the reference limit for Creatinine is approximately 13% higher for people identified as African-American. .   Edwena Felty Ratio 02/19/2019 NOT APPLICABLE  6 - 22 (calc) Final  . Sodium 02/19/2019 141  135 - 146 mmol/L Final  . Potassium 02/19/2019 4.5  3.5 - 5.3 mmol/L Final  . Chloride 02/19/2019 104  98 - 110 mmol/L Final  . CO2 02/19/2019 29  20 - 32 mmol/L Final  . Calcium 02/19/2019 9.0  8.6 - 10.4 mg/dL Final  . Total Protein 02/19/2019 6.6  6.1 - 8.1 g/dL Final  . Albumin 01/60/1093 4.1  3.6 - 5.1 g/dL Final  . Globulin 23/55/7322 2.5  1.9 - 3.7 g/dL (calc) Final  . AG Ratio 02/19/2019 1.6  1.0 - 2.5 (calc) Final  . Total Bilirubin 02/19/2019 0.4  0.2 - 1.2 mg/dL Final  . Alkaline phosphatase (APISO) 02/19/2019 53  37 - 153  U/L Final  . AST 02/19/2019 19  10 - 35 U/L Final  . ALT 02/19/2019 14  6 - 29 U/L Final  . Cholesterol 02/19/2019 173  <200 mg/dL Final  . HDL 16/10/960410/11/2018 66  > OR = 50 mg/dL Final  . Triglycerides 02/19/2019 60  <150 mg/dL Final  . LDL Cholesterol (Calc) 02/19/2019 92  mg/dL (calc) Final   Comment: Reference range: <100 . Desirable range <100 mg/dL for primary prevention;   <70 mg/dL for patients with CHD or diabetic patients  with > or = 2 CHD risk factors. Marland Kitchen. LDL-C is now calculated using the Martin-Hopkins  calculation, which is a validated novel method providing  better accuracy than the Friedewald equation in the  estimation of LDL-C.  Horald PollenMartin SS et al. Lenox AhrJAMA. 5409;811(912013;310(19): 2061-2068  (http://education.[facility].com/faq/FAQ164)   . Total CHOL/HDL Ratio 02/19/2019 2.6  <4.7<5.0 (calc)  Final  . Non-HDL Cholesterol (Calc) 02/19/2019 107  <130 mg/dL (calc) Final   Comment: For patients with diabetes plus 1 major ASCVD risk  factor, treating to a non-HDL-C goal of <100 mg/dL  (LDL-C of <82<70 mg/dL) is considered a therapeutic  option.     Past Medical History:  Diagnosis Date  . Allergy   . Hyperlipidemia   . Hypertension    Past Surgical History:  Procedure Laterality Date  . CESAREAN SECTION     Current Outpatient Medications on File Prior to Visit  Medication Sig Dispense Refill  . aspirin 81 MG tablet Take 81 mg by mouth every other day.     Marland Kitchen. CALCIUM-VITAMIN D PO Take by mouth.    . fluticasone (FLONASE) 50 MCG/ACT nasal spray USE 2 SPRAYS IN EACH NOSTRIL ONCE DAILY 45 g 6  . hydrochlorothiazide (HYDRODIURIL) 25 MG tablet Take 1 tablet by mouth once daily 90 tablet 2  . loratadine (CLARITIN) 10 MG tablet Take 10 mg by mouth daily.    . Multiple Vitamins-Minerals (MULTIVITAMIN,TX-MINERALS) tablet Take 1 tablet by mouth daily.      . simvastatin (ZOCOR) 40 MG tablet TAKE 1 TABLET BY MOUTH AT BEDTIME 30 tablet 0   No current facility-administered medications on file prior to visit.    No Known Allergies Social History   Socioeconomic History  . Marital status: Married    Spouse name: Not on file  . Number of children: Not on file  . Years of education: Not on file  . Highest education level: Not on file  Occupational History  . Not on file  Social Needs  . Financial resource strain: Not on file  . Food insecurity    Worry: Not on file    Inability: Not on file  . Transportation needs    Medical: Not on file    Non-medical: Not on file  Tobacco Use  . Smoking status: Never Smoker  . Smokeless tobacco: Never Used  Substance and Sexual Activity  . Alcohol use: No  . Drug use: No  . Sexual activity: Not on file  Lifestyle  . Physical activity    Days per week: Not on file    Minutes per session: Not on file  . Stress: Not on file   Relationships  . Social Musicianconnections    Talks on phone: Not on file    Gets together: Not on file    Attends religious service: Not on file    Active member of club or organization: Not on file    Attends meetings of clubs or organizations: Not on file  Relationship status: Not on file  . Intimate partner violence    Fear of current or ex partner: Not on file    Emotionally abused: Not on file    Physically abused: Not on file    Forced sexual activity: Not on file  Other Topics Concern  . Not on file  Social History Narrative  . Not on file   Family History  Problem Relation Age of Onset  . Heart disease Mother   . Hypertension Mother   . Arthritis Mother   . Heart disease Father       Review of Systems  All other systems reviewed and are negative.      Objective:   Physical Exam  Constitutional: She is oriented to person, place, and time. She appears well-developed and well-nourished. No distress.  HENT:  Head: Normocephalic and atraumatic.  Right Ear: External ear normal.  Left Ear: External ear normal.  Nose: Nose normal.  Mouth/Throat: Oropharynx is clear and moist. No oropharyngeal exudate.  Eyes: Pupils are equal, round, and reactive to light. Conjunctivae and EOM are normal. Right eye exhibits no discharge. Left eye exhibits no discharge. No scleral icterus.  Neck: Normal range of motion. Neck supple. No JVD present. No tracheal deviation present. No thyromegaly present.  Cardiovascular: Normal rate, regular rhythm, normal heart sounds and intact distal pulses. Exam reveals no gallop and no friction rub.  No murmur heard. Pulmonary/Chest: Effort normal and breath sounds normal. No stridor. No respiratory distress. She has no wheezes. She has no rales. She exhibits no tenderness.  Abdominal: Soft. Bowel sounds are normal. She exhibits no distension and no mass. There is no abdominal tenderness. There is no rebound and no guarding.  Musculoskeletal: Normal  range of motion.        General: No tenderness, deformity or edema.  Lymphadenopathy:    She has no cervical adenopathy.  Neurological: She is alert and oriented to person, place, and time. She has normal reflexes. No cranial nerve deficit. She exhibits normal muscle tone. Coordination normal.  Skin: Skin is warm. No rash noted. She is not diaphoretic. No erythema. No pallor.  Psychiatric: She has a normal mood and affect. Her behavior is normal. Judgment and thought content normal.  Vitals reviewed.         Assessment & Plan:  Routine general medical examination at a health care facility  Need for immunization against influenza - Plan: Flu Vaccine QUAD High Dose(Fluad)  Benign essential HTN  Pure hypercholesterolemia  I am very happy with the patient's blood work today.  Her cholesterol is outstanding.  Her blood sugar slightly high at 103.  We discussed a low carbohydrate diet to address this.  Her blood pressures at home are well controlled between 120 and 130/70-85.  She does have an element of whitecoat syndrome and therefore blood pressure slightly higher today in the office.  She received her flu shot today.  She prefers to get her mammogram and her bone density test at her gynecologist office.  She denies any issues with falls, memory loss, or depression.  Routine anticipatory guidance is provided.  She declines hepatitis C screening.

## 2019-03-10 ENCOUNTER — Other Ambulatory Visit: Payer: Self-pay | Admitting: Family Medicine

## 2019-04-09 ENCOUNTER — Other Ambulatory Visit: Payer: Self-pay | Admitting: Family Medicine

## 2019-04-16 ENCOUNTER — Other Ambulatory Visit: Payer: Self-pay | Admitting: Obstetrics and Gynecology

## 2019-04-16 DIAGNOSIS — E2839 Other primary ovarian failure: Secondary | ICD-10-CM

## 2019-04-16 LAB — HM MAMMOGRAPHY

## 2019-05-07 ENCOUNTER — Other Ambulatory Visit: Payer: Self-pay | Admitting: Family Medicine

## 2019-06-04 ENCOUNTER — Telehealth: Payer: Self-pay

## 2019-06-04 ENCOUNTER — Other Ambulatory Visit: Payer: Self-pay

## 2019-06-04 DIAGNOSIS — E7800 Pure hypercholesterolemia, unspecified: Secondary | ICD-10-CM

## 2019-06-04 MED ORDER — SIMVASTATIN 40 MG PO TABS: 40.0000 mg | ORAL_TABLET | Freq: Every day | ORAL | 2 refills | Status: DC

## 2019-06-04 NOTE — Telephone Encounter (Signed)
Error

## 2019-06-07 ENCOUNTER — Ambulatory Visit: Payer: BC Managed Care – PPO | Attending: Internal Medicine

## 2019-06-07 DIAGNOSIS — Z23 Encounter for immunization: Secondary | ICD-10-CM

## 2019-06-07 NOTE — Progress Notes (Signed)
   Covid-19 Vaccination [facility]  Name:  Amy Dennis    MRN: [id number] DOB: 06-16-1950  06/07/2019  [name] was observed post Covid-19 immunization for 15 minutes without incidence. She was provided with Vaccine Information Sheet and instruction to access the V-Safe system.   [name] was instructed to call 911 with any severe reactions post vaccine: Marland Kitchen Difficulty breathing  . Swelling of your face and throat  . A fast heartbeat  . A bad rash all over your body  . Dizziness and weakness    Immunizations Administered    Name Date Dose VIS Date Route   Pfizer COVID-19 Vaccine 06/07/2019 12:59 PM 0.3 mL 04/25/2019 Intramuscular   Manufacturer: [employer], [employer]   Lot: [id number]   NDC: [address]-2

## 2019-06-09 ENCOUNTER — Encounter: Payer: Self-pay | Admitting: Cardiology

## 2019-06-09 ENCOUNTER — Other Ambulatory Visit: Payer: Self-pay

## 2019-06-09 ENCOUNTER — Ambulatory Visit (INDEPENDENT_AMBULATORY_CARE_PROVIDER_SITE_OTHER): Payer: Medicare PPO | Admitting: Cardiology

## 2019-06-09 VITALS — BP 141/81 | HR 70 | Temp 97.0°F | Ht 64.5 in | Wt 155.0 lb

## 2019-06-09 DIAGNOSIS — Z8249 Family history of ischemic heart disease and other diseases of the circulatory system: Secondary | ICD-10-CM

## 2019-06-09 DIAGNOSIS — R011 Cardiac murmur, unspecified: Secondary | ICD-10-CM

## 2019-06-09 DIAGNOSIS — Z7982 Long term (current) use of aspirin: Secondary | ICD-10-CM

## 2019-06-09 DIAGNOSIS — E7800 Pure hypercholesterolemia, unspecified: Secondary | ICD-10-CM

## 2019-06-09 DIAGNOSIS — Z7189 Other specified counseling: Secondary | ICD-10-CM

## 2019-06-09 DIAGNOSIS — I10 Essential (primary) hypertension: Secondary | ICD-10-CM

## 2019-06-09 DIAGNOSIS — Z7182 Exercise counseling: Secondary | ICD-10-CM

## 2019-06-09 NOTE — Patient Instructions (Signed)
Medication Instructions:  Your Physician recommend you continue on your current medication as directed.    *If you need a refill on your cardiac medications before your next appointment, please call your pharmacy*  Lab Work: None  Testing/Procedures: Your physician has requested that you have an echocardiogram. Echocardiography is a painless test that uses sound waves to create images of your heart. It provides your doctor with information about the size and shape of your heart and how well your heart's chambers and valves are working. This procedure takes approximately one hour. There are no restrictions for this procedure. [address]   Follow-Up: At [facility], you and your health needs are our priority.  As part of our continuing mission to provide you with exceptional heart care, we have created designated Provider Care Teams.  These Care Teams include your primary Cardiologist (physician) and Advanced Practice Providers (APPs -  Physician Assistants and Nurse Practitioners) who all work together to provide you with the care you need, when you need it.  Your next appointment:   1 year(s)  The format for your next appointment:   In Person  Provider:   Bridgette Christopher, MD   
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
## 2019-06-18 ENCOUNTER — Ambulatory Visit (HOSPITAL_COMMUNITY): Payer: Medicare PPO | Attending: Cardiology

## 2019-06-18 ENCOUNTER — Other Ambulatory Visit: Payer: Self-pay

## 2019-06-18 DIAGNOSIS — Z8249 Family history of ischemic heart disease and other diseases of the circulatory system: Secondary | ICD-10-CM | POA: Insufficient documentation

## 2019-06-18 DIAGNOSIS — I10 Essential (primary) hypertension: Secondary | ICD-10-CM | POA: Insufficient documentation

## 2019-06-18 DIAGNOSIS — R011 Cardiac murmur, unspecified: Secondary | ICD-10-CM | POA: Insufficient documentation

## 2019-06-18 DIAGNOSIS — E7800 Pure hypercholesterolemia, unspecified: Secondary | ICD-10-CM | POA: Insufficient documentation

## 2019-06-28 ENCOUNTER — Ambulatory Visit: Payer: Medicare PPO | Attending: Internal Medicine

## 2019-06-28 DIAGNOSIS — Z23 Encounter for immunization: Secondary | ICD-10-CM | POA: Insufficient documentation

## 2019-06-28 NOTE — Progress Notes (Signed)
   Covid-19 Vaccination Clinic  Name:  [name]    MRN: [id number] DOB: [birth year]  06/28/2019  Amy Dennis was observed post Covid-19 immunization for 15 minutes without incidence. She was provided with Vaccine Information Sheet and instruction to access the V-Safe system.   Amy Dennis was instructed to call 911 with any severe reactions post vaccine: Marland Kitchen Difficulty breathing  . Swelling of your face and throat  . A fast heartbeat  . A bad rash all over your body  . Dizziness and weakness    Immunizations Administered    Name Date Dose VIS Date Route   Pfizer COVID-19 Vaccine 06/28/2019 11:43 AM 0.3 mL 04/25/2019 Intramuscular   Manufacturer: [employer], [employer]   Lot: [id number]   NDC: [address]-5

## 2019-07-07 ENCOUNTER — Ambulatory Visit
Admission: RE | Admit: 2019-07-07 | Discharge: 2019-07-07 | Disposition: A | Discharge status: Home / Self Care | Payer: Medicare PPO | Source: Ambulatory Visit | Attending: Obstetrics and Gynecology | Admitting: Obstetrics and Gynecology

## 2019-07-07 ENCOUNTER — Other Ambulatory Visit: Payer: Self-pay

## 2019-07-07 DIAGNOSIS — E2839 Other primary ovarian failure: Secondary | ICD-10-CM

## 2019-07-07 DIAGNOSIS — Z780 Asymptomatic menopausal state: Secondary | ICD-10-CM | POA: Diagnosis not present

## 2019-07-15 ENCOUNTER — Other Ambulatory Visit: Payer: Self-pay | Admitting: Family Medicine

## 2019-10-01 ENCOUNTER — Other Ambulatory Visit: Payer: Self-pay | Admitting: Family Medicine

## 2019-11-11 DIAGNOSIS — M25551 Pain in right hip: Secondary | ICD-10-CM | POA: Diagnosis not present

## 2020-01-03 ENCOUNTER — Other Ambulatory Visit: Payer: Self-pay | Admitting: Family Medicine

## 2020-02-01 ENCOUNTER — Other Ambulatory Visit: Payer: Self-pay | Admitting: Family Medicine

## 2020-02-04 DIAGNOSIS — H524 Presbyopia: Secondary | ICD-10-CM | POA: Diagnosis not present

## 2020-02-18 ENCOUNTER — Ambulatory Visit (INDEPENDENT_AMBULATORY_CARE_PROVIDER_SITE_OTHER): Payer: Medicare PPO | Admitting: *Deleted

## 2020-02-18 ENCOUNTER — Other Ambulatory Visit: Payer: Self-pay

## 2020-02-18 DIAGNOSIS — Z23 Encounter for immunization: Secondary | ICD-10-CM

## 2020-03-08 ENCOUNTER — Other Ambulatory Visit: Payer: Medicare PPO

## 2020-03-09 ENCOUNTER — Other Ambulatory Visit: Payer: Self-pay | Admitting: Family Medicine

## 2020-03-12 DIAGNOSIS — D0472 Carcinoma in situ of skin of left lower limb, including hip: Secondary | ICD-10-CM | POA: Diagnosis not present

## 2020-03-12 DIAGNOSIS — D0461 Carcinoma in situ of skin of right upper limb, including shoulder: Secondary | ICD-10-CM | POA: Diagnosis not present

## 2020-03-12 DIAGNOSIS — L570 Actinic keratosis: Secondary | ICD-10-CM | POA: Diagnosis not present

## 2020-03-12 DIAGNOSIS — L814 Other melanin hyperpigmentation: Secondary | ICD-10-CM | POA: Diagnosis not present

## 2020-03-12 DIAGNOSIS — D225 Melanocytic nevi of trunk: Secondary | ICD-10-CM | POA: Diagnosis not present

## 2020-03-12 DIAGNOSIS — Z85828 Personal history of other malignant neoplasm of skin: Secondary | ICD-10-CM | POA: Diagnosis not present

## 2020-03-12 DIAGNOSIS — L821 Other seborrheic keratosis: Secondary | ICD-10-CM | POA: Diagnosis not present

## 2020-03-16 ENCOUNTER — Other Ambulatory Visit: Payer: Self-pay

## 2020-03-16 ENCOUNTER — Other Ambulatory Visit: Payer: Medicare PPO

## 2020-03-16 DIAGNOSIS — E7800 Pure hypercholesterolemia, unspecified: Secondary | ICD-10-CM

## 2020-03-16 DIAGNOSIS — E785 Hyperlipidemia, unspecified: Secondary | ICD-10-CM

## 2020-03-16 DIAGNOSIS — E559 Vitamin D deficiency, unspecified: Secondary | ICD-10-CM | POA: Diagnosis not present

## 2020-03-16 DIAGNOSIS — I10 Essential (primary) hypertension: Secondary | ICD-10-CM

## 2020-03-17 LAB — COMPLETE METABOLIC PANEL WITH GFR
AG Ratio: 1.6 (calc) (ref 1.0–2.5)
ALT: 11 U/L (ref 6–29)
AST: 16 U/L (ref 10–35)
Albumin: 4.1 g/dL (ref 3.6–5.1)
Alkaline phosphatase (APISO): 58 U/L (ref 37–153)
BUN: 11 mg/dL (ref 7–25)
CO2: 29 mmol/L (ref 20–32)
Calcium: 9 mg/dL (ref 8.6–10.4)
Chloride: 101 mmol/L (ref 98–110)
Creat: 0.79 mg/dL (ref 0.50–0.99)
GFR, Est African American: 89 mL/min/{1.73_m2} (ref >=60)
GFR, Est Non African American: 77 mL/min/{1.73_m2} (ref >=60)
Globulin: 2.5 g/dL (calc) (ref 1.9–3.7)
Glucose, Bld: 101 mg/dL — ABNORMAL HIGH (ref 65–99)
Potassium: 4.1 mmol/L (ref 3.5–5.3)
Sodium: 140 mmol/L (ref 135–146)
Total Bilirubin: 0.4 mg/dL (ref 0.2–1.2)
Total Protein: 6.6 g/dL (ref 6.1–8.1)

## 2020-03-17 LAB — CBC WITH DIFFERENTIAL/PLATELET
Absolute Monocytes: 461 cells/uL (ref 200–950)
Basophils Absolute: 58 cells/uL (ref 0–200)
Basophils Relative: 1.1 %
Eosinophils Absolute: 212 cells/uL (ref 15–500)
Eosinophils Relative: 4 %
HCT: 36.6 % (ref 35.0–45.0)
Hemoglobin: 12.4 g/dL (ref 11.7–15.5)
Lymphs Abs: 1261 cells/uL (ref 850–3900)
MCH: 29.4 pg (ref 27.0–33.0)
MCHC: 33.9 g/dL (ref 32.0–36.0)
MCV: 86.7 fL (ref 80.0–100.0)
MPV: 13.3 fL — ABNORMAL HIGH (ref 7.5–12.5)
Monocytes Relative: 8.7 %
Neutro Abs: 3307 cells/uL (ref 1500–7800)
Neutrophils Relative %: 62.4 %
Platelets: 181 10*3/uL (ref 140–400)
RBC: 4.22 10*6/uL (ref 3.80–5.10)
RDW: 13.1 % (ref 11.0–15.0)
Total Lymphocyte: 23.8 %
WBC: 5.3 10*3/uL (ref 3.8–10.8)

## 2020-03-17 LAB — LIPID PANEL
Cholesterol: 167 mg/dL (ref <200)
HDL: 63 mg/dL (ref >=50)
LDL Cholesterol (Calc): 85 mg/dL (calc)
Non-HDL Cholesterol (Calc): 104 mg/dL (calc) (ref <130)
Total CHOL/HDL Ratio: 2.7 (calc) (ref <5.0)
Triglycerides: 98 mg/dL (ref <150)

## 2020-03-17 LAB — VITAMIN D 25 HYDROXY (VIT D DEFICIENCY, FRACTURES): Vit D, 25-Hydroxy: 74 ng/mL (ref 30–100)

## 2020-03-18 ENCOUNTER — Other Ambulatory Visit: Payer: Self-pay

## 2020-03-18 ENCOUNTER — Ambulatory Visit (INDEPENDENT_AMBULATORY_CARE_PROVIDER_SITE_OTHER): Payer: Medicare PPO | Admitting: Family Medicine

## 2020-03-18 VITALS — BP 140/80 | HR 77 | Temp 97.3°F | Ht 64.0 in | Wt 154.0 lb

## 2020-03-18 DIAGNOSIS — Z0001 Encounter for general adult medical examination with abnormal findings: Secondary | ICD-10-CM

## 2020-03-18 DIAGNOSIS — E7800 Pure hypercholesterolemia, unspecified: Secondary | ICD-10-CM

## 2020-03-18 DIAGNOSIS — I10 Essential (primary) hypertension: Secondary | ICD-10-CM | POA: Diagnosis not present

## 2020-03-18 DIAGNOSIS — Z0000 Encounter for general adult medical examination without abnormal findings: Secondary | ICD-10-CM

## 2020-03-18 MED ORDER — SIMVASTATIN 40 MG PO TABS
40.0000 mg | ORAL_TABLET | Freq: Every day | ORAL | 3 refills | Status: DC
Start: 2020-03-18 — End: 2021-06-13

## 2020-03-18 MED ORDER — HYDROCHLOROTHIAZIDE 25 MG PO TABS
25.0000 mg | ORAL_TABLET | Freq: Every day | ORAL | 3 refills | Status: DC
Start: 2020-03-18 — End: 2021-03-18

## 2020-03-18 NOTE — Progress Notes (Signed)
Subjective:    Patient ID: Amy Dennis, female    DOB: 18-Oct-1950, 69 y.o.   MRN: [id number]  HPI Patient is here today for complete physical exam.  Her last colonoscopy was in October 2019.  Although she did have one polyp, GI recommended repeat colonoscopy in 10 years due to the fact it was simply a hyperplastic polyp.  Her mammogram and pelvic exam were performed by her gynecologist.  These were performed this year and were normal.  Had a bone density test last year.  T score was -0.9 in the right hip.  Recommended calcium 1200 mg a day and vitamin D 1000 units a day.  Blood pressure today is acceptable at 140/80.  Immunizations are up-to-date as shown below.  She also has had her third dose of the Covid vaccine.  She denies any falls, depression, or memory loss.  Immunization History  Administered Date(s) Administered  . Fluad Quad(high Dose 65+) 02/25/2019, 02/18/2020  . Influenza Whole 02/13/2008  . Influenza, High Dose Seasonal PF 02/28/2017  . Influenza,inj,Quad PF,6+ Mos 03/27/2013, 02/12/2014, 03/02/2015, 02/29/2016, 03/12/2018  . PFIZER SARS-COV-2 Vaccination 06/07/2019, 06/28/2019  . Pneumococcal Conjugate-13 04/29/2018  . Pneumococcal Polysaccharide-23 09/12/2016  . Tdap 08/19/2013  . Zoster 07/11/2012   Lab on 03/16/2020  Component Date Value Ref Range Status  . Cholesterol 03/16/2020 167  <200 mg/dL Final  . HDL 73/41/9379 63  > OR = 50 mg/dL Final  . Triglycerides 03/16/2020 98  <150 mg/dL Final  . LDL Cholesterol (Calc) 03/16/2020 85  mg/dL (calc) Final   Comment: Reference range: <100 . Desirable range <100 mg/dL for primary prevention;   <70 mg/dL for patients with CHD or diabetic patients  with > or = 2 CHD risk factors. Marland Kitchen LDL-C is now calculated using the Martin-Hopkins  calculation, which is a validated novel method providing  better accuracy than the Friedewald equation in the  estimation of LDL-C.  Horald Pollen et al. Lenox Ahr. 0240;973(53): 2061-2068    (http://education.[facility].com/faq/FAQ164)   . Total CHOL/HDL Ratio 03/16/2020 2.7  <2.9 (calc) Final  . Non-HDL Cholesterol (Calc) 03/16/2020 104  <130 mg/dL (calc) Final   Comment: For patients with diabetes plus 1 major ASCVD risk  factor, treating to a non-HDL-C goal of <100 mg/dL  (LDL-C of <92 mg/dL) is considered a therapeutic  option.   . Glucose, Bld 03/16/2020 101* 65 - 99 mg/dL Final   Comment: .            Fasting reference interval . For someone without known diabetes, a glucose value between 100 and 125 mg/dL is consistent with prediabetes and should be confirmed with a follow-up test. .   . BUN 03/16/2020 11  7 - 25 mg/dL Final  . Creat 42/68/3419 0.79  0.50 - 0.99 mg/dL Final   Comment: For patients >8 years of age, the reference limit for Creatinine is approximately 13% higher for people identified as African-American. .   . GFR, Est Non African American 03/16/2020 77  > OR = 60 mL/min/1.60m2 Final  . GFR, Est African American 03/16/2020 89  > OR = 60 mL/min/1.68m2 Final  . BUN/Creatinine Ratio 03/16/2020 NOT APPLICABLE  6 - 22 (calc) Final  . Sodium 03/16/2020 140  135 - 146 mmol/L Final  . Potassium 03/16/2020 4.1  3.5 - 5.3 mmol/L Final  . Chloride 03/16/2020 101  98 - 110 mmol/L Final  . CO2 03/16/2020 29  20 - 32 mmol/L Final  . Calcium 03/16/2020 9.0  8.6 -  10.4 mg/dL Final  . Total Protein 03/16/2020 6.6  6.1 - 8.1 g/dL Final  . Albumin 95/28/413211/06/2019 4.1  3.6 - 5.1 g/dL Final  . Globulin 44/01/027211/06/2019 2.5  1.9 - 3.7 g/dL (calc) Final  . AG Ratio 03/16/2020 1.6  1.0 - 2.5 (calc) Final  . Total Bilirubin 03/16/2020 0.4  0.2 - 1.2 mg/dL Final  . Alkaline phosphatase (APISO) 03/16/2020 58  37 - 153 U/L Final  . AST 03/16/2020 16  10 - 35 U/L Final  . ALT 03/16/2020 11  6 - 29 U/L Final  . WBC 03/16/2020 5.3  3.8 - 10.8 Thousand/uL Final  . RBC 03/16/2020 4.22  3.80 - 5.10 Million/uL Final  . Hemoglobin 03/16/2020 12.4  11.7 - 15.5 g/dL Final  .  HCT 53/66/440311/06/2019 36.6  35 - 45 % Final  . MCV 03/16/2020 86.7  80.0 - 100.0 fL Final  . MCH 03/16/2020 29.4  27.0 - 33.0 pg Final  . MCHC 03/16/2020 33.9  32.0 - 36.0 g/dL Final  . RDW 47/42/595611/06/2019 13.1  11.0 - 15.0 % Final  . Platelets 03/16/2020 181  140 - 400 Thousand/uL Final  . MPV 03/16/2020 13.3* 7.5 - 12.5 fL Final  . Neutro Abs 03/16/2020 3,307  1,500 - 7,800 cells/uL Final  . Lymphs Abs 03/16/2020 1,261  850 - 3,900 cells/uL Final  . Absolute Monocytes 03/16/2020 461  200 - 950 cells/uL Final  . Eosinophils Absolute 03/16/2020 212  15.0 - 500.0 cells/uL Final  . Basophils Absolute 03/16/2020 58  0.0 - 200.0 cells/uL Final  . Neutrophils Relative % 03/16/2020 62.4  % Final  . Total Lymphocyte 03/16/2020 23.8  % Final  . Monocytes Relative 03/16/2020 8.7  % Final  . Eosinophils Relative 03/16/2020 4.0  % Final  . Basophils Relative 03/16/2020 1.1  % Final  . Vit D, 25-Hydroxy 03/16/2020 74  30 - 100 ng/mL Final   Comment: Vitamin D Status         25-OH Vitamin D: . Deficiency:                    <20 ng/mL Insufficiency:             20 - 29 ng/mL Optimal:                 > or = 30 ng/mL . For 25-OH Vitamin D testing on patients on  D2-supplementation and patients for whom quantitation  of D2 and D3 fractions is required, the [facility]ure[facility](TM) 25-OH VIT D, (D2,D3), LC/MS/MS is recommended: order  code [id number] (patients >7028yrs). See Note 1 . Note 1 . For additional information, please refer to  http://education.[facility].com/faq/FAQ199  (This link is being provided for informational/ educational purposes only.)     Past Medical History:  Diagnosis Date  . Allergy   . Arthritis    Phreesia 03/17/2020  . Hyperlipidemia   . Hypertension    Past Surgical History:  Procedure Laterality Date  . CESAREAN SECTION     Current Outpatient Medications on File Prior to Visit  Medication Sig Dispense Refill  . CALCIUM-VITAMIN D PO Take by mouth.    . cholecalciferol  (VITAMIN D3) 25 MCG (1000 UT) tablet Take 1,000 Units by mouth daily.    . fluticasone (FLONASE) 50 MCG/ACT nasal spray Use 2 spray(s) in each nostril once daily 48 g 0  . Multiple Vitamins-Minerals (MULTIVITAMIN,TX-MINERALS) tablet Take 1 tablet by mouth daily.      . naproxen sodium (ALEVE) 220  MG tablet Take 220 mg by mouth.    Marland Kitchen aspirin 81 MG tablet Take 81 mg by mouth every other day.  (Patient not taking: Reported on 03/18/2020)     No current facility-administered medications on file prior to visit.   Allergies  Allergen Reactions  . Mobic [Meloxicam]     diarrhea   Social History   Socioeconomic History  . Marital status: Married    Spouse name: Not on file  . Number of children: Not on file  . Years of education: Not on file  . Highest education level: Not on file  Occupational History  . Not on file  Tobacco Use  . Smoking status: Never Smoker  . Smokeless tobacco: Never Used  Substance and Sexual Activity  . Alcohol use: No  . Drug use: No  . Sexual activity: Not on file  Other Topics Concern  . Not on file  Social History Narrative  . Not on file   Social Determinants of Health   Financial Resource Strain:   . Difficulty of Paying Living Expenses: Not on file  Food Insecurity:   . Worried About Programme researcher, broadcasting/film/video in the Last Year: Not on file  . Ran Out of Food in the Last Year: Not on file  Transportation Needs:   . Lack of Transportation (Medical): Not on file  . Lack of Transportation (Non-Medical): Not on file  Physical Activity:   . Days of Exercise per Week: Not on file  . Minutes of Exercise per Session: Not on file  Stress:   . Feeling of Stress : Not on file  Social Connections:   . Frequency of Communication with Friends and Family: Not on file  . Frequency of Social Gatherings with Friends and Family: Not on file  . Attends Religious Services: Not on file  . Active Member of Clubs or Organizations: Not on file  . Attends Tax inspector Meetings: Not on file  . Marital Status: Not on file  Intimate Partner Violence:   . Fear of Current or Ex-Partner: Not on file  . Emotionally Abused: Not on file  . Physically Abused: Not on file  . Sexually Abused: Not on file   Family History  Problem Relation Age of Onset  . Heart disease Mother   . Hypertension Mother   . Arthritis Mother   . Heart disease Father       Review of Systems  All other systems reviewed and are negative.      Objective:   Physical Exam Vitals reviewed.  Constitutional:      General: She is not in acute distress.    Appearance: She is well-developed. She is not diaphoretic.  HENT:     Head: Normocephalic and atraumatic.     Right Ear: External ear normal.     Left Ear: External ear normal.     Nose: Nose normal.     Mouth/Throat:     Pharynx: No oropharyngeal exudate.  Eyes:     General: No scleral icterus.       Right eye: No discharge.        Left eye: No discharge.     Conjunctiva/sclera: Conjunctivae normal.     Pupils: Pupils are equal, round, and reactive to light.  Neck:     Thyroid: No thyromegaly.     Vascular: No JVD.     Trachea: No tracheal deviation.  Cardiovascular:     Rate and Rhythm: Normal rate and  regular rhythm.     Heart sounds: Normal heart sounds. No murmur heard.  No friction rub. No gallop.   Pulmonary:     Effort: Pulmonary effort is normal. No respiratory distress.     Breath sounds: Normal breath sounds. No stridor. No wheezing or rales.  Chest:     Chest wall: No tenderness.  Abdominal:     General: Bowel sounds are normal. There is no distension.     Palpations: Abdomen is soft. There is no mass.     Tenderness: There is no abdominal tenderness. There is no guarding or rebound.  Musculoskeletal:        General: No tenderness or deformity. Normal range of motion.     Cervical back: Normal range of motion and neck supple.  Lymphadenopathy:     Cervical: No cervical adenopathy.   Skin:    General: Skin is warm.     Coloration: Skin is not pale.     Findings: No erythema or rash.  Neurological:     Mental Status: She is alert and oriented to person, place, and time.     Cranial Nerves: No cranial nerve deficit.     Motor: No abnormal muscle tone.     Coordination: Coordination normal.     Deep Tendon Reflexes: Reflexes are normal and symmetric.  Psychiatric:        Behavior: Behavior normal.        Thought Content: Thought content normal.        Judgment: Judgment normal.           Assessment & Plan:  Routine general medical examination at a health care facility  Pure hypercholesterolemia  Benign essential HTN   Lab work is excellent.  Her cholesterol is outstanding.  Her blood sugar slightly high at 101.  Mammogram, colonoscopy, bone density are all up-to-date.  Colonoscopy is up-to-date.  She denies any falls, depression, or memory loss.  Regular anticipatory guidance is provided.

## 2020-04-29 DIAGNOSIS — Z01419 Encounter for gynecological examination (general) (routine) without abnormal findings: Secondary | ICD-10-CM | POA: Diagnosis not present

## 2020-04-29 DIAGNOSIS — Z124 Encounter for screening for malignant neoplasm of cervix: Secondary | ICD-10-CM | POA: Diagnosis not present

## 2020-04-29 DIAGNOSIS — Z1231 Encounter for screening mammogram for malignant neoplasm of breast: Secondary | ICD-10-CM | POA: Diagnosis not present

## 2020-04-29 DIAGNOSIS — Z01411 Encounter for gynecological examination (general) (routine) with abnormal findings: Secondary | ICD-10-CM | POA: Diagnosis not present

## 2020-05-04 ENCOUNTER — Other Ambulatory Visit: Payer: Self-pay | Admitting: Family Medicine

## 2020-06-23 ENCOUNTER — Other Ambulatory Visit: Payer: Self-pay

## 2020-06-23 ENCOUNTER — Ambulatory Visit: Payer: Medicare PPO | Admitting: Cardiology

## 2020-06-23 ENCOUNTER — Encounter: Payer: Self-pay | Admitting: Cardiology

## 2020-06-23 VITALS — BP 136/74 | HR 69 | Ht 64.0 in | Wt 156.4 lb

## 2020-06-23 DIAGNOSIS — I10 Essential (primary) hypertension: Secondary | ICD-10-CM | POA: Diagnosis not present

## 2020-06-23 DIAGNOSIS — Z7189 Other specified counseling: Secondary | ICD-10-CM | POA: Diagnosis not present

## 2020-06-23 DIAGNOSIS — E7800 Pure hypercholesterolemia, unspecified: Secondary | ICD-10-CM | POA: Diagnosis not present

## 2020-06-23 DIAGNOSIS — Z8249 Family history of ischemic heart disease and other diseases of the circulatory system: Secondary | ICD-10-CM

## 2020-06-23 DIAGNOSIS — R011 Cardiac murmur, unspecified: Secondary | ICD-10-CM

## 2020-06-23 NOTE — Patient Instructions (Addendum)
Medication Instructions:  Your Physician recommend you continue on your current medication as directed.    *If you need a refill on your cardiac medications before your next appointment, please call your pharmacy*   Lab Work: None   Testing/Procedures: Calcium Score 1126 [address]   Follow-Up: At [facility], you and your health needs are our priority.  As part of our continuing mission to provide you with exceptional heart care, we have created designated Provider Care Teams.  These Care Teams include your primary Cardiologist (physician) and Advanced Practice Providers (APPs -  Physician Assistants and Nurse Practitioners) who all work together to provide you with the care you need, when you need it.  We recommend signing up for the patient portal called "MyChart".  Sign up information is provided on this After Visit Summary.  MyChart is used to connect with patients for Virtual Visits (Telemedicine).  Patients are able to view lab/test results, encounter notes, upcoming appointments, etc.  Non-urgent messages can be sent to your provider as well.   To learn more about what you can do with MyChart, go to [email or web address].    Your next appointment:   1 year(s)  The format for your next appointment:   In Person  Provider:   Jodelle Red, MD   Other Instructions how to check blood pressure:  -sit comfortably in a chair, feet uncrossed and flat on floor, for 5-10 minutes  -arm ideally should rest at the level of the heart. However, arm should be relaxed and not tense (for example, do not hold the arm up unsupported)  -avoid exercise, caffeine, and tobacco for at least 30 minutes prior to BP reading  -don't take BP cuff reading over clothes (always place on skin directly)  -I prefer to know how well the medication is working, so I would like you to take your readings 1-2 hours after taking your blood pressure medication if possible  Goal is average  <130/80, but if consistently top number more than 140-150 call me.
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
## 2020-07-16 ENCOUNTER — Other Ambulatory Visit: Payer: Self-pay

## 2020-07-16 ENCOUNTER — Ambulatory Visit (INDEPENDENT_AMBULATORY_CARE_PROVIDER_SITE_OTHER)
Admission: RE | Admit: 2020-07-16 | Discharge: 2020-07-16 | Disposition: A | Discharge status: Home / Self Care | Payer: Self-pay | Source: Ambulatory Visit | Attending: Cardiology | Admitting: Cardiology

## 2020-07-16 DIAGNOSIS — E7800 Pure hypercholesterolemia, unspecified: Secondary | ICD-10-CM

## 2020-07-16 DIAGNOSIS — Z8249 Family history of ischemic heart disease and other diseases of the circulatory system: Secondary | ICD-10-CM

## 2020-08-12 ENCOUNTER — Other Ambulatory Visit: Payer: Self-pay | Admitting: Family Medicine

## 2020-12-08 DIAGNOSIS — Z23 Encounter for immunization: Secondary | ICD-10-CM | POA: Diagnosis not present

## 2021-01-27 ENCOUNTER — Ambulatory Visit: Payer: Medicare PPO | Admitting: Family Medicine

## 2021-01-27 ENCOUNTER — Other Ambulatory Visit: Payer: Self-pay

## 2021-01-27 ENCOUNTER — Encounter: Payer: Self-pay | Admitting: Family Medicine

## 2021-01-27 VITALS — BP 124/78 | HR 82 | Temp 98.8°F | Resp 14 | Ht 64.0 in | Wt 134.0 lb

## 2021-01-27 DIAGNOSIS — Z23 Encounter for immunization: Secondary | ICD-10-CM | POA: Diagnosis not present

## 2021-01-27 DIAGNOSIS — I10 Essential (primary) hypertension: Secondary | ICD-10-CM | POA: Diagnosis not present

## 2021-01-27 DIAGNOSIS — R58 Hemorrhage, not elsewhere classified: Secondary | ICD-10-CM | POA: Diagnosis not present

## 2021-01-27 DIAGNOSIS — E7800 Pure hypercholesterolemia, unspecified: Secondary | ICD-10-CM

## 2021-01-27 DIAGNOSIS — R233 Spontaneous ecchymoses: Secondary | ICD-10-CM | POA: Diagnosis not present

## 2021-01-27 NOTE — Progress Notes (Signed)
Subjective:    Patient ID: Amy Dennis, female    DOB: [birth year], 70 y.o.   MRN: [id number]  HPI Patient is a very pleasant 70 year old Caucasian female who presents today with recurrent conjunctival hemorrhages in both eyes.  This is occurred sporadically over this week.  It first occurred on Sunday and her right.  She then had similar hemorrhage in her left eye.  That stopped however last night it happened again.  This has the patient concerned.  She has been checking her blood pressure.  Her blood pressures at home have been between 120 and 140/80-90.  Her blood pressure here is outstanding.  She denies any vaginal bleeding.  She denies any bruising.  She denies any melena or hematochezia.  She denies any hematuria.  She denies any night sweats or fevers or myalgias or body aches.  The bleeding is limited just to the eyes.  She denies any gingival bleeding Past Medical History:  Diagnosis Date   Allergy    Arthritis    Phreesia 03/17/2020   Hyperlipidemia    Hypertension    Past Surgical History:  Procedure Laterality Date   CESAREAN SECTION     Current Outpatient Medications on File Prior to Visit  Medication Sig Dispense Refill   aspirin 81 MG tablet Take 81 mg by mouth every other day.  (Patient not taking: No sig reported)     Bacillus Coagulans-Inulin (ALIGN PREBIOTIC-PROBIOTIC PO) Take by mouth daily.     CALCIUM-VITAMIN D PO Take by mouth.     cholecalciferol (VITAMIN D3) 25 MCG (1000 UT) tablet Take 1,000 Units by mouth daily.     diphenhydramine-acetaminophen (TYLENOL PM) 25-500 MG TABS tablet Take 1 tablet by mouth at bedtime.     fluticasone (FLONASE) 50 MCG/ACT nasal spray Use 2 spray(s) in each nostril once daily 48 g 0   hydrochlorothiazide (HYDRODIURIL) 25 MG tablet Take 1 tablet (25 mg total) by mouth daily. 90 tablet 3   Multiple Vitamins-Minerals (MULTIVITAMIN,TX-MINERALS) tablet Take 1 tablet by mouth daily.   (Patient not taking: Reported on 06/23/2020)      naproxen sodium (ALEVE) 220 MG tablet Take 220 mg by mouth. (Patient not taking: Reported on 06/23/2020)     simvastatin (ZOCOR) 40 MG tablet Take 1 tablet (40 mg total) by mouth at bedtime. 90 tablet 3   No current facility-administered medications on file prior to visit.   Allergies  Allergen Reactions   Mobic [Meloxicam]     diarrhea   Social History   Socioeconomic History   Marital status: Married    Spouse name: Not on file   Number of children: Not on file   Years of education: Not on file   Highest education level: Not on file  Occupational History   Not on file  Tobacco Use   Smoking status: Never   Smokeless tobacco: Never  Substance and Sexual Activity   Alcohol use: No   Drug use: No   Sexual activity: Not on file  Other Topics Concern   Not on file  Social History Narrative   Not on file   Social Determinants of Health   Financial Resource Strain: Not on file  Food Insecurity: Not on file  Transportation Needs: Not on file  Physical Activity: Not on file  Stress: Not on file  Social Connections: Not on file  Intimate Partner Violence: Not on file   Family History  Problem Relation Age of Onset   Heart disease Mother  Hypertension Mother    Arthritis Mother    Heart disease Father       Review of Systems  All other systems reviewed and are negative.     Objective:   Physical Exam Vitals reviewed.  Constitutional:      General: She is not in acute distress.    Appearance: She is well-developed. She is not diaphoretic.  HENT:     Head: Normocephalic and atraumatic.     Right Ear: External ear normal.     Left Ear: External ear normal.     Nose: Nose normal.     Mouth/Throat:     Pharynx: No oropharyngeal exudate.  Eyes:     General: No scleral icterus.       Right eye: No discharge.        Left eye: No discharge.     Conjunctiva/sclera: Conjunctivae normal.     Pupils: Pupils are equal, round, and reactive to light.  Neck:      Thyroid: No thyromegaly.     Vascular: No JVD.     Trachea: No tracheal deviation.  Cardiovascular:     Rate and Rhythm: Normal rate and regular rhythm.     Heart sounds: Normal heart sounds. No murmur heard.   No friction rub. No gallop.  Pulmonary:     Effort: Pulmonary effort is normal. No respiratory distress.     Breath sounds: Normal breath sounds. No stridor. No wheezing or rales.  Chest:     Chest wall: No tenderness.  Abdominal:     General: Bowel sounds are normal. There is no distension.     Palpations: Abdomen is soft. There is no mass.     Tenderness: There is no abdominal tenderness. There is no guarding or rebound.  Musculoskeletal:        General: No tenderness or deformity. Normal range of motion.     Cervical back: Normal range of motion and neck supple.  Lymphadenopathy:     Cervical: No cervical adenopathy.  Skin:    General: Skin is warm.     Coloration: Skin is not pale.     Findings: No erythema or rash.  Neurological:     Mental Status: She is alert and oriented to person, place, and time.     Cranial Nerves: No cranial nerve deficit.     Motor: No abnormal muscle tone.     Coordination: Coordination normal.     Deep Tendon Reflexes: Reflexes are normal and symmetric.  Psychiatric:        Behavior: Behavior normal.        Thought Content: Thought content normal.        Judgment: Judgment normal.          Assessment & Plan:  Benign essential HTN  Pure hypercholesterolemia - Plan: CBC with Differential/Platelet, COMPLETE METABOLIC PANEL WITH GFR, Lipid panel  Recurrent bleeding - Plan: CBC with Differential/Platelet, Protime-INR, APTT Her blood pressure here today is excellent so I do not feel that her blood pressure was the cause of her conjunctival hemorrhage.  While she is here in fasting I will check a lipid panel.  I will also check a CBC and a CMP.  Given the recurrent bleeding, I want a rule out a coagulopathy so I will check a PT and a PTT  however I suspect that if there was a coagulopathy she would be having bleeding in other areas besides her eyes.  Therefore if the lab work is normal  and her blood pressure remains well controlled as it is today, I feel that this is most likely a "fluke".  She admits that she has been moving firewood and I believe that some of this could have been due to straining picking up pieces of firewood and putting them in her truck or rolling them on the ground.

## 2021-01-28 LAB — LIPID PANEL
Cholesterol: 180 mg/dL (ref <200)
HDL: 69 mg/dL (ref >=50)
LDL Cholesterol (Calc): 95 mg/dL (calc)
Non-HDL Cholesterol (Calc): 111 mg/dL (calc) (ref <130)
Total CHOL/HDL Ratio: 2.6 (calc) (ref <5.0)
Triglycerides: 70 mg/dL (ref <150)

## 2021-01-28 LAB — CBC WITH DIFFERENTIAL/PLATELET
Absolute Monocytes: 318 cells/uL (ref 200–950)
Basophils Absolute: 48 cells/uL (ref 0–200)
Basophils Relative: 0.9 %
Eosinophils Absolute: 138 cells/uL (ref 15–500)
Eosinophils Relative: 2.6 %
HCT: 39.3 % (ref 35.0–45.0)
Hemoglobin: 12.9 g/dL (ref 11.7–15.5)
Lymphs Abs: 1092 cells/uL (ref 850–3900)
MCH: 28.7 pg (ref 27.0–33.0)
MCHC: 32.8 g/dL (ref 32.0–36.0)
MCV: 87.3 fL (ref 80.0–100.0)
MPV: 13.1 fL — ABNORMAL HIGH (ref 7.5–12.5)
Monocytes Relative: 6 %
Neutro Abs: 3705 cells/uL (ref 1500–7800)
Neutrophils Relative %: 69.9 %
Platelets: 192 10*3/uL (ref 140–400)
RBC: 4.5 10*6/uL (ref 3.80–5.10)
RDW: 13.4 % (ref 11.0–15.0)
Total Lymphocyte: 20.6 %
WBC: 5.3 10*3/uL (ref 3.8–10.8)

## 2021-01-28 LAB — COMPLETE METABOLIC PANEL WITH GFR
AG Ratio: 1.7 (calc) (ref 1.0–2.5)
ALT: 9 U/L (ref 6–29)
AST: 15 U/L (ref 10–35)
Albumin: 4.4 g/dL (ref 3.6–5.1)
Alkaline phosphatase (APISO): 56 U/L (ref 37–153)
BUN: 14 mg/dL (ref 7–25)
CO2: 32 mmol/L (ref 20–32)
Calcium: 9.7 mg/dL (ref 8.6–10.4)
Chloride: 103 mmol/L (ref 98–110)
Creat: 0.74 mg/dL (ref 0.50–1.05)
Globulin: 2.6 g/dL (calc) (ref 1.9–3.7)
Glucose, Bld: 100 mg/dL — ABNORMAL HIGH (ref 65–99)
Potassium: 4.4 mmol/L (ref 3.5–5.3)
Sodium: 140 mmol/L (ref 135–146)
Total Bilirubin: 0.5 mg/dL (ref 0.2–1.2)
Total Protein: 7 g/dL (ref 6.1–8.1)
eGFR: 88 mL/min/{1.73_m2} (ref >=60)

## 2021-01-28 LAB — PROTIME-INR
INR: 0.9
Prothrombin Time: 9.7 s (ref 9.0–11.5)

## 2021-01-28 LAB — APTT: aPTT: 25 s (ref 23–32)

## 2021-02-04 ENCOUNTER — Other Ambulatory Visit: Payer: Self-pay | Admitting: Family Medicine

## 2021-03-15 ENCOUNTER — Encounter: Payer: Self-pay | Admitting: Family Medicine

## 2021-03-15 DIAGNOSIS — D485 Neoplasm of uncertain behavior of skin: Secondary | ICD-10-CM | POA: Diagnosis not present

## 2021-03-15 DIAGNOSIS — Z85828 Personal history of other malignant neoplasm of skin: Secondary | ICD-10-CM | POA: Diagnosis not present

## 2021-03-15 DIAGNOSIS — L570 Actinic keratosis: Secondary | ICD-10-CM | POA: Diagnosis not present

## 2021-03-15 DIAGNOSIS — D0472 Carcinoma in situ of skin of left lower limb, including hip: Secondary | ICD-10-CM | POA: Diagnosis not present

## 2021-03-15 DIAGNOSIS — L814 Other melanin hyperpigmentation: Secondary | ICD-10-CM | POA: Diagnosis not present

## 2021-03-15 DIAGNOSIS — L821 Other seborrheic keratosis: Secondary | ICD-10-CM | POA: Diagnosis not present

## 2021-03-15 DIAGNOSIS — D1801 Hemangioma of skin and subcutaneous tissue: Secondary | ICD-10-CM | POA: Diagnosis not present

## 2021-03-17 ENCOUNTER — Other Ambulatory Visit: Payer: Self-pay | Admitting: Family Medicine

## 2021-03-24 DIAGNOSIS — D0472 Carcinoma in situ of skin of left lower limb, including hip: Secondary | ICD-10-CM | POA: Diagnosis not present

## 2021-03-24 DIAGNOSIS — Z85828 Personal history of other malignant neoplasm of skin: Secondary | ICD-10-CM | POA: Diagnosis not present

## 2021-03-24 DIAGNOSIS — L570 Actinic keratosis: Secondary | ICD-10-CM | POA: Diagnosis not present

## 2021-04-01 ENCOUNTER — Ambulatory Visit (INDEPENDENT_AMBULATORY_CARE_PROVIDER_SITE_OTHER): Payer: Medicare PPO

## 2021-04-01 ENCOUNTER — Other Ambulatory Visit: Payer: Self-pay

## 2021-04-01 VITALS — Ht 64.0 in | Wt 134.0 lb

## 2021-04-01 DIAGNOSIS — Z0000 Encounter for general adult medical examination without abnormal findings: Secondary | ICD-10-CM

## 2021-04-01 NOTE — Patient Instructions (Signed)
Amy Dennis , Thank you for taking time to come for your Medicare Wellness Visit. I appreciate your ongoing commitment to your health goals. Please review the following plan we discussed and let me know if I can assist you in the future.   Screening recommendations/referrals: Colonoscopy: Done 02/12/2018 Repeat in 10 years  Mammogram: Done 2022. Please have GYN send copy to our office. Bone Density: Done 07/07/2019 Repeat every 2 years  Recommended yearly ophthalmology/optometry visit for glaucoma screening and checkup Recommended yearly dental visit for hygiene and checkup  Vaccinations: Influenza vaccine: Done 01/27/2021 Pneumococcal vaccine: Done 09/12/2016 and 04/29/2018 Tdap vaccine: Done 08/19/2013 Shingles vaccine: Shingrix discussed. Please contact your pharmacy for coverage information.     Covid-19:Done 06/07/19, 06/28/19, 02/04/20 and 12/08/2020.  Advanced directives: Please bring a copy of your health care power of attorney and living will to the office to be added to your chart at your convenience.   Conditions/risks identified: KEEP UP THE GOOD WORK!!  Next appointment: Follow up in one year for your annual wellness visit 2023.   Preventive Care 21 Years and Older, Female Preventive care refers to lifestyle choices and visits with your health care provider that can promote health and wellness. What does preventive care include? A yearly physical exam. This is also called an annual well check. Dental exams once or twice a year. Routine eye exams. Ask your health care provider how often you should have your eyes checked. Personal lifestyle choices, including: Daily care of your teeth and gums. Regular physical activity. Eating a healthy diet. Avoiding tobacco and drug use. Limiting alcohol use. Practicing safe sex. Taking low-dose aspirin every day. Taking vitamin and mineral supplements as recommended by your health care provider. What happens during an annual well  check? The services and screenings done by your health care provider during your annual well check will depend on your age, overall health, lifestyle risk factors, and family history of disease. Counseling  Your health care provider may ask you questions about your: Alcohol use. Tobacco use. Drug use. Emotional well-being. Home and relationship well-being. Sexual activity. Eating habits. History of falls. Memory and ability to understand (cognition). Work and work Astronomer. Reproductive health. Screening  You may have the following tests or measurements: Height, weight, and BMI. Blood pressure. Lipid and cholesterol levels. These may be checked every 5 years, or more frequently if you are over 74 years old. Skin check. Lung cancer screening. You may have this screening every year starting at age 76 if you have a 30-pack-year history of smoking and currently smoke or have quit within the past 15 years. Fecal occult blood test (FOBT) of the stool. You may have this test every year starting at age 75. Flexible sigmoidoscopy or colonoscopy. You may have a sigmoidoscopy every 5 years or a colonoscopy every 10 years starting at age 18. Hepatitis C blood test. Hepatitis B blood test. Sexually transmitted disease (STD) testing. Diabetes screening. This is done by checking your blood sugar (glucose) after you have not eaten for a while (fasting). You may have this done every 1-3 years. Bone density scan. This is done to screen for osteoporosis. You may have this done starting at age 64. Mammogram. This may be done every 1-2 years. Talk to your health care provider about how often you should have regular mammograms. Talk with your health care provider about your test results, treatment options, and if necessary, the need for more tests. Vaccines  Your health care provider may recommend certain  vaccines, such as: Influenza vaccine. This is recommended every year. Tetanus, diphtheria, and  acellular pertussis (Tdap, Td) vaccine. You may need a Td booster every 10 years. Zoster vaccine. You may need this after age 13. Pneumococcal 13-valent conjugate (PCV13) vaccine. One dose is recommended after age 1. Pneumococcal polysaccharide (PPSV23) vaccine. One dose is recommended after age 6. Talk to your health care provider about which screenings and vaccines you need and how often you need them. This information is not intended to replace advice given to you by your health care provider. Make sure you discuss any questions you have with your health care provider. Document Released: 05/28/2015 Document Revised: 01/19/2016 Document Reviewed: 03/02/2015 Elsevier Interactive Patient Education  2017 [place] Prevention in the Home Falls can cause injuries. They can happen to people of all ages. There are many things you can do to make your home safe and to help prevent falls. What can I do on the outside of my home? Regularly fix the edges of walkways and driveways and fix any cracks. Remove anything that might make you trip as you walk through a door, such as a raised step or threshold. Trim any bushes or trees on the path to your home. Use bright outdoor lighting. Clear any walking paths of anything that might make someone trip, such as rocks or tools. Regularly check to see if handrails are loose or broken. Make sure that both sides of any steps have handrails. Any raised decks and porches should have guardrails on the edges. Have any leaves, snow, or ice cleared regularly. Use sand or salt on walking paths during winter. Clean up any spills in your garage right away. This includes oil or grease spills. What can I do in the bathroom? Use night lights. Install grab bars by the toilet and in the tub and shower. Do not use towel bars as grab bars. Use non-skid mats or decals in the tub or shower. If you need to sit down in the shower, use a plastic, non-slip stool. Keep  the floor dry. Clean up any water that spills on the floor as soon as it happens. Remove soap buildup in the tub or shower regularly. Attach bath mats securely with double-sided non-slip rug tape. Do not have throw rugs and other things on the floor that can make you trip. What can I do in the bedroom? Use night lights. Make sure that you have a light by your bed that is easy to reach. Do not use any sheets or blankets that are too big for your bed. They should not hang down onto the floor. Have a firm chair that has side arms. You can use this for support while you get dressed. Do not have throw rugs and other things on the floor that can make you trip. What can I do in the kitchen? Clean up any spills right away. Avoid walking on wet floors. Keep items that you use a lot in easy-to-reach places. If you need to reach something above you, use a strong step stool that has a grab bar. Keep electrical cords out of the way. Do not use floor polish or wax that makes floors slippery. If you must use wax, use non-skid floor wax. Do not have throw rugs and other things on the floor that can make you trip. What can I do with my stairs? Do not leave any items on the stairs. Make sure that there are handrails on both sides of the stairs  and use them. Fix handrails that are broken or loose. Make sure that handrails are as long as the stairways. Check any carpeting to make sure that it is firmly attached to the stairs. Fix any carpet that is loose or worn. Avoid having throw rugs at the top or bottom of the stairs. If you do have throw rugs, attach them to the floor with carpet tape. Make sure that you have a light switch at the top of the stairs and the bottom of the stairs. If you do not have them, ask someone to add them for you. What else can I do to help prevent falls? Wear shoes that: Do not have high heels. Have rubber bottoms. Are comfortable and fit you well. Are closed at the toe. Do not  wear sandals. If you use a stepladder: Make sure that it is fully opened. Do not climb a closed stepladder. Make sure that both sides of the stepladder are locked into place. Ask someone to hold it for you, if possible. Clearly mark and make sure that you can see: Any grab bars or handrails. First and last steps. Where the edge of each step is. Use tools that help you move around (mobility aids) if they are needed. These include: Canes. Walkers. Scooters. Crutches. Turn on the lights when you go into a dark area. Replace any light bulbs as soon as they burn out. Set up your furniture so you have a clear path. Avoid moving your furniture around. If any of your floors are uneven, fix them. If there are any pets around you, be aware of where they are. Review your medicines with your doctor. Some medicines can make you feel dizzy. This can increase your chance of falling. Ask your doctor what other things that you can do to help prevent falls. This information is not intended to replace advice given to you by your health care provider. Make sure you discuss any questions you have with your health care provider. Document Released: 02/25/2009 Document Revised: 10/07/2015 Document Reviewed: 06/05/2014 Elsevier Interactive Patient Education  2017 [employer].

## 2021-04-01 NOTE — Progress Notes (Signed)
Subjective:   Amy Dennis is a 70 y.o. female who presents for Medicare Annual (Subsequent) preventive examination. Virtual Visit via Telephone Note  I connected with  Amy Dennis on 04/01/21 at  9:00 AM EST by telephone and verified that I am speaking with the correct person using two identifiers.  Location: Patient: Home Provider: [facility] Persons participating in the virtual visit: patient/Nurse Health Advisor   I discussed the limitations, risks, security and privacy concerns of performing an evaluation and management service by telephone and the availability of in person appointments. The patient expressed understanding and agreed to proceed.  Interactive audio and video telecommunications were attempted between this nurse and patient, however failed, due to patient having technical difficulties OR patient did not have access to video capability.  We continued and completed visit with audio only.  Some vital signs may be absent or patient reported.   Darral Dash, LPN  Review of Systems     Cardiac Risk Factors include: advanced age (>39men, >69 women);hypertension;dyslipidemia     Objective:    Today's Vitals   04/01/21 0902  Weight: 134 lb (60.8 kg)  Height: 5\' 4"  (1.626 m)   Body mass index is 23 kg/m.  Advanced Directives 04/01/2021 03/18/2020  Does Patient Have a Medical Advance Directive? Yes Yes  Type of 13/08/2019 of [place];Living will Healthcare Power of [place];Living will;Out of facility DNR (pink MOST or yellow form)  Copy of Healthcare Power of Attorney in Chart? No - copy requested -  Would patient like information on creating a medical advance directive? No - Patient declined -    Current Medications (verified) Outpatient Encounter Medications as of 04/01/2021  Medication Sig   CALCIUM-VITAMIN D PO Take by mouth.   fluticasone (FLONASE) 50 MCG/ACT nasal spray Use 2 spray(s) in each nostril once daily    hydrochlorothiazide (HYDRODIURIL) 25 MG tablet Take 1 tablet by mouth once daily   ibuprofen (ADVIL) 200 MG tablet Take 200 mg by mouth every 6 (six) hours as needed.   loratadine (CLARITIN) 10 MG tablet Take 10 mg by mouth daily.   simvastatin (ZOCOR) 40 MG tablet Take 1 tablet (40 mg total) by mouth at bedtime.   No facility-administered encounter medications on file as of 04/01/2021.    Allergies (verified) Mobic [meloxicam]   History: Past Medical History:  Diagnosis Date   Allergy    Arthritis    [employer]reesia 03/17/2020   Hyperlipidemia    Hypertension    Past Surgical History:  Procedure Laterality Date   CESAREAN SECTION     Family History  Problem Relation Age of Onset   Heart disease Mother    Hypertension Mother    Arthritis Mother    Heart disease Father    Social History   Socioeconomic History   Marital status: Married    Spouse name: 13/07/2019   Number of children: 2   Years of education: Not on file   Highest education level: Not on file  Occupational History   Not on file  Tobacco Use   Smoking status: Never   Smokeless tobacco: Never  Substance and Sexual Activity   Alcohol use: No   Drug use: No   Sexual activity: Not on file  Other Topics Concern   Not on file  Social History Narrative   Married x 48 years in 2022.   1 grandchild age 40   Social Determinants of Health   Financial Resource Strain: Low Risk  Difficulty of Paying Living Expenses: Not hard at all  Food Insecurity: No Food Insecurity   Worried About Running Out of Food in the Last Year: Never true   Ran Out of Food in the Last Year: Never true  Transportation Needs: No Transportation Needs   Lack of Transportation (Medical): No   Lack of Transportation (Non-Medical): No  Physical Activity: Sufficiently Active   Days of Exercise per Week: 5 days   Minutes of Exercise per Session: 30 min  Stress: No Stress Concern Present   Feeling of Stress : Not at all  Social Connections:  Socially Integrated   Frequency of Communication with Friends and Family: More than three times a week   Frequency of Social Gatherings with Friends and Family: More than three times a week   Attends Religious Services: More than 4 times per year   Active Member of [employer] or Organizations: Yes   Attends Engineer, structural: More than 4 times per year   Marital Status: Married    Tobacco Counseling Counseling given: Not Answered   Clinical Intake:  Pre-visit preparation completed: Yes  Pain : No/denies pain     BMI - recorded: 23 Nutritional Status: BMI of 19-24  Normal Nutritional Risks: None Diabetes: No  How often do you need to have someone help you when you read instructions, pamphlets, or other written materials from your doctor or pharmacy?: 1 - Never  Diabetic?No  Interpreter Needed?: No  Information entered by :: MJ Careli Luzader, LPN   Activities of Daily Living In your present state of health, do you have any difficulty performing the following activities: 04/01/2021  Hearing? N  Vision? N  Difficulty concentrating or making decisions? N  Walking or climbing stairs? N  Dressing or bathing? N  Doing errands, shopping? N  Preparing Food and eating ? N  Using the Toilet? N  In the past six months, have you accidently leaked urine? N  Do you have problems with loss of bowel control? N  Managing your Medications? N  Managing your Finances? N  Housekeeping or managing your Housekeeping? N  Some recent data might be hidden    Patient Care Team: Donita Brooks, MD as PCP - General (Family Medicine) Jodelle Red, MD as PCP - Cardiology (Cardiology)  Indicate any recent Medical Services you may have received from other than [facility] providers in the past year (date may be approximate).     Assessment:   This is a routine wellness examination for Amy Dennis.  Hearing/Vision screen Hearing Screening - Comments:: No hearing issues.  Vision Screening  - Comments:: Readers. Dr. Lorin Picket 04/2021.  Dietary issues and exercise activities discussed: Current Exercise Habits: Home exercise routine, Type of exercise: walking;Other - see comments (Hula hoop and jump roping.), Time (Minutes): 45, Frequency (Times/Week): 5, Weekly Exercise (Minutes/Week): 225, Intensity: Moderate, Exercise limited by: cardiac condition(s)   Goals Addressed             This Visit's Progress    Exercise 3x per week (30 min per time)       Continue to exercise and maintain health.       Depression Screen PHQ 2/9 Scores 04/01/2021 03/18/2020 02/25/2019 10/10/2016 09/12/2016 08/19/2013  PHQ - 2 Score 0 0 0 0 0 0  PHQ- 9 Score - - 0 - 0 -    Fall Risk Fall Risk  04/01/2021 03/18/2020 02/25/2019 10/10/2016  Falls in the past year? 0 0 0 No  Number falls in past  yr: 0 0 0 -  Injury with Fall? 0 0 - -  Risk for fall due to : No Fall Risks - - -  Follow up Falls prevention discussed - Falls evaluation completed -    FALL RISK PREVENTION PERTAINING TO THE HOME:  Any stairs in or around the home? Yes  If so, are there any without handrails? No  Home free of loose throw rugs in walkways, pet beds, electrical cords, etc? Yes  Adequate lighting in your home to reduce risk of falls? Yes   ASSISTIVE DEVICES UTILIZED TO PREVENT FALLS:  Life alert? No  Use of a cane, walker or w/c? No  Grab bars in the bathroom? No  Shower chair or bench in shower? Yes  Elevated toilet seat or a handicapped toilet? No   TIMED UP AND GO:  Was the test performed? No . Phone visit.   Cognitive Function: Normal cognitive status assessed by direct observation by this Nurse Health Advisor. No abnormalities found.       6CIT Screen 03/18/2020  What Year? 0 points  What month? 0 points  What time? 0 points  Count back from 20 0 points  Months in reverse 0 points  Repeat phrase 0 points  Total Score 0    Immunizations Immunization History  Administered Date(s) Administered    Fluad Quad(high Dose 65+) 02/25/2019, 02/18/2020, 01/27/2021   Influenza Whole 02/13/2008   Influenza, High Dose Seasonal PF 02/28/2017   Influenza,inj,Quad PF,6+ Mos 03/27/2013, 02/12/2014, 03/02/2015, 02/29/2016, 03/12/2018   PFIZER Comirnaty(Gray Top)Covid-19 Tri-Sucrose Vaccine 12/08/2020   PFIZER(Purple Top)SARS-COV-2 Vaccination 06/07/2019, 06/28/2019, 02/04/2020   Pneumococcal Conjugate-13 04/29/2018   Pneumococcal Polysaccharide-23 09/12/2016   Tdap 08/19/2013   Zoster, Live 07/11/2012    TDAP status: Up to date  Flu Vaccine status: Up to date  Pneumococcal vaccine status: Up to date  Covid-19 vaccine status: Completed vaccines  Qualifies for Shingles Vaccine? Yes   Zostavax completed Yes   Shingrix Completed?: No.    Education has been provided regarding the importance of this vaccine. Patient has been advised to call insurance company to determine out of pocket expense if they have not yet received this vaccine. Advised may also receive vaccine at local pharmacy or Health Dept. Verbalized acceptance and understanding.  Screening Tests Health Maintenance  Topic Date Due   Zoster Vaccines- Shingrix (1 of 2) Never done   COVID-19 Vaccine (5 - Booster for Pfizer series) 02/02/2021   MAMMOGRAM  04/15/2021   TETANUS/TDAP  08/20/2023   COLONOSCOPY (Pts 45-19yrs Insurance coverage will need to be confirmed)  02/13/2028   Pneumonia Vaccine 63+ Years old  Completed   INFLUENZA VACCINE  Completed   DEXA SCAN  Completed   HPV VACCINES  Aged Out   Hepatitis C Screening  Discontinued    Health Maintenance  Health Maintenance Due  Topic Date Due   Zoster Vaccines- Shingrix (1 of 2) Never done   COVID-19 Vaccine (5 - Booster for Pfizer series) 02/02/2021    Colorectal cancer screening: Type of screening: Colonoscopy. Completed 02/12/2018. Repeat every 10 years  Mammogram status: Completed 2022 at [facility] per pt. Repeat every year  Bone Density status: Completed  07/07/2019. Results reflect: Bone density results: NORMAL. Repeat every 3 years.  Lung Cancer Screening: (Low Dose CT Chest recommended if Age 53-80 years, 30 pack-year currently smoking OR have quit w/in 15years.) does not qualify.  Non smoker  Additional Screening:  Hepatitis C Screening: does qualify; Completed Not competed yet.  Vision Screening: Recommended annual ophthalmology exams for early detection of glaucoma and other disorders of the eye. Is the patient up to date with their annual eye exam?  Yes  Who is the provider or what is the name of the office in which the patient attends annual eye exams? Dr. Lorin Picket If pt is not established with a provider, would they like to be referred to a provider to establish care? No .   Dental Screening: Recommended annual dental exams for proper oral hygiene  Community Resource Referral / Chronic Care Management: CRR required this visit?  No   CCM required this visit?  No      Plan:     I have personally reviewed and noted the following in the patient's chart:   Medical and social history Use of alcohol, tobacco or illicit drugs  Current medications and supplements including opioid prescriptions.  Functional ability and status Nutritional status Physical activity Advanced directives List of other physicians Hospitalizations, surgeries, and ER visits in previous 12 months Vitals Screenings to include cognitive, depression, and falls Referrals and appointments  In addition, I have reviewed and discussed with patient certain preventive protocols, quality metrics, and best practice recommendations. A written personalized care plan for preventive services as well as general preventive health recommendations were provided to patient.     Darral Dash, LPN   [id number]   Nurse Notes: Up to date on all health maintenance. Pt has mammograms done at [facility]. Pt asked to have copy sent to [place] of 2022 exam. Pt verbalized  understanding. Discussed shingles vaccine and how to obtain.

## 2021-04-25 ENCOUNTER — Other Ambulatory Visit: Payer: Self-pay | Admitting: Family Medicine

## 2021-05-11 DIAGNOSIS — H5203 Hypermetropia, bilateral: Secondary | ICD-10-CM | POA: Diagnosis not present

## 2021-05-13 ENCOUNTER — Encounter: Payer: Self-pay | Admitting: Family Medicine

## 2021-05-13 DIAGNOSIS — Z01419 Encounter for gynecological examination (general) (routine) without abnormal findings: Secondary | ICD-10-CM | POA: Diagnosis not present

## 2021-05-13 DIAGNOSIS — Z124 Encounter for screening for malignant neoplasm of cervix: Secondary | ICD-10-CM | POA: Diagnosis not present

## 2021-05-13 DIAGNOSIS — Z01411 Encounter for gynecological examination (general) (routine) with abnormal findings: Secondary | ICD-10-CM | POA: Diagnosis not present

## 2021-05-13 DIAGNOSIS — Z6824 Body mass index (BMI) 24.0-24.9, adult: Secondary | ICD-10-CM | POA: Diagnosis not present

## 2021-05-13 DIAGNOSIS — Z1231 Encounter for screening mammogram for malignant neoplasm of breast: Secondary | ICD-10-CM | POA: Diagnosis not present

## 2021-06-12 ENCOUNTER — Other Ambulatory Visit: Payer: Self-pay | Admitting: Family Medicine

## 2021-06-14 ENCOUNTER — Other Ambulatory Visit: Payer: Self-pay | Admitting: Family Medicine

## 2021-06-20 ENCOUNTER — Ambulatory Visit (HOSPITAL_BASED_OUTPATIENT_CLINIC_OR_DEPARTMENT_OTHER): Payer: Medicare PPO | Admitting: Cardiology

## 2021-06-21 ENCOUNTER — Encounter (HOSPITAL_BASED_OUTPATIENT_CLINIC_OR_DEPARTMENT_OTHER): Payer: Self-pay | Admitting: Cardiology

## 2021-06-21 ENCOUNTER — Ambulatory Visit (HOSPITAL_BASED_OUTPATIENT_CLINIC_OR_DEPARTMENT_OTHER): Payer: Medicare PPO | Admitting: Cardiology

## 2021-06-21 ENCOUNTER — Other Ambulatory Visit: Payer: Self-pay

## 2021-06-21 VITALS — BP 136/82 | HR 72 | Ht 64.0 in | Wt 140.6 lb

## 2021-06-21 DIAGNOSIS — E7800 Pure hypercholesterolemia, unspecified: Secondary | ICD-10-CM | POA: Diagnosis not present

## 2021-06-21 DIAGNOSIS — I10 Essential (primary) hypertension: Secondary | ICD-10-CM | POA: Diagnosis not present

## 2021-06-21 DIAGNOSIS — I700 Atherosclerosis of aorta: Secondary | ICD-10-CM | POA: Diagnosis not present

## 2021-06-21 DIAGNOSIS — Z7189 Other specified counseling: Secondary | ICD-10-CM | POA: Diagnosis not present

## 2021-06-21 DIAGNOSIS — Z8249 Family history of ischemic heart disease and other diseases of the circulatory system: Secondary | ICD-10-CM | POA: Diagnosis not present
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
## 2021-06-21 NOTE — Patient Instructions (Signed)

## 2021-07-06 ENCOUNTER — Other Ambulatory Visit: Payer: Self-pay | Admitting: Family Medicine

## 2021-08-22 ENCOUNTER — Telehealth: Payer: Self-pay | Admitting: Family Medicine

## 2021-08-22 NOTE — Telephone Encounter (Signed)
Received call from patient to report she and her spouse both tested positive for COVID today; requesting Rx for medication to manage sx so they don't get any worse. Patient currently has a runny nose. ? ?Please advise at [phone].  ?

## 2021-08-23 ENCOUNTER — Other Ambulatory Visit: Payer: Self-pay | Admitting: Family Medicine

## 2021-08-23 MED ORDER — NIRMATRELVIR/RITONAVIR (PAXLOVID)TABLET: 3.0000 | ORAL_TABLET | Freq: Two times a day (BID) | ORAL | 0 refills | Status: AC

## 2021-08-23 NOTE — Telephone Encounter (Signed)
Patient advised. Nothing further needed at this time.  ? ?

## 2021-08-29 ENCOUNTER — Other Ambulatory Visit: Payer: Self-pay | Admitting: Family Medicine

## 2021-08-29 MED ORDER — PREDNISONE 20 MG PO TABS: ORAL_TABLET | ORAL | 0 refills | Status: DC

## 2021-09-07 ENCOUNTER — Other Ambulatory Visit: Payer: Self-pay | Admitting: Family Medicine

## 2021-09-19 ENCOUNTER — Other Ambulatory Visit: Payer: Self-pay | Admitting: Family Medicine

## 2021-09-20 NOTE — Telephone Encounter (Signed)
Requested Prescriptions  ?Pending Prescriptions Disp Refills  ?? fluticasone (FLONASE) 50 MCG/ACT nasal spray [Pharmacy Med Name: Fluticasone Propionate 50 MCG/ACT Nasal Suspension] 48 g 3  ?  Sig: Use 2 spray(s) in each nostril once daily  ?  ? Ear, Nose, and Throat: Nasal Preparations - Corticosteroids Passed - 09/19/2021  6:01 AM  ?  ?  Passed - Valid encounter within last 12 months  ?  Recent Outpatient Visits   ?      ? 7 months ago Benign essential HTN  ? [facility] [name], [name], MD  ? 1 year ago Routine general medical examination at a health care facility  ? [facility] [name], [name], MD  ? 2 years ago Routine general medical examination at a health care facility  ? [facility] [name], [name], MD  ? 3 years ago Benign essential HTN  ? [facility] [name], [name], MD  ? 4 years ago General medical exam  ? [facility] Donita Brooks, MD  ?  ?  ? ?  ?  ?  ? ? ?

## 2021-09-27 ENCOUNTER — Ambulatory Visit: Payer: Medicare PPO | Admitting: Family Medicine

## 2021-09-27 VITALS — BP 140/82 | HR 73 | Temp 97.6°F | Ht 64.0 in | Wt 143.8 lb

## 2021-09-27 DIAGNOSIS — R1032 Left lower quadrant pain: Secondary | ICD-10-CM

## 2021-09-27 MED ORDER — AMOXICILLIN-POT CLAVULANATE 875-125 MG PO TABS: 1.0000 | ORAL_TABLET | Freq: Two times a day (BID) | ORAL | 0 refills | Status: DC

## 2021-09-27 NOTE — Progress Notes (Signed)
? ?Subjective:  ? ? Patient ID: Amy Dennis, female    DOB: 05-21-1950, 71 y.o.   MRN: [id number] ? ?Sinusitis ? ?Patient reports pain and pressure around her nasal bridge.  She has been taking Flonase.  She has been taking Claritin.  This been going on now for several weeks.  She believes she has a sinus infection.  However she also developed a fever to 100.3 on Saturday evening and she was having intense left lower quadrant abdominal pain.  Abdominal pain is slightly better today although she still feels pain and tenderness in that area.  She is also been having diarrhea ?Past Medical History:  ?Diagnosis Date  ? Allergy   ? Arthritis   ? Phreesia 03/17/2020  ? Hyperlipidemia   ? Hypertension   ? ?Past Surgical History:  ?Procedure Laterality Date  ? CESAREAN SECTION    ? ?Current Outpatient Medications on File Prior to Visit  ?Medication Sig Dispense Refill  ? CALCIUM-VITAMIN D PO Take by mouth.    ? fluticasone (FLONASE) 50 MCG/ACT nasal spray Use 2 spray(s) in each nostril once daily 48 g 3  ? hydrochlorothiazide (HYDRODIURIL) 25 MG tablet Take 1 tablet by mouth once daily 90 tablet 0  ? ibuprofen (ADVIL) 200 MG tablet Take 200 mg by mouth every 6 (six) hours as needed.    ? loratadine (CLARITIN) 10 MG tablet Take 10 mg by mouth daily.    ? simvastatin (ZOCOR) 40 MG tablet TAKE 1 TABLET BY MOUTH AT BEDTIME 90 tablet 0  ? predniSONE (DELTASONE) 20 MG tablet 3 tabs poqday 1-2, 2 tabs poqday 3-4, 1 tab poqday 5-6 (Patient not taking: Reported on 09/27/2021) 12 tablet 0  ? ?No current facility-administered medications on file prior to visit.  ? ?Allergies  ?Allergen Reactions  ? Mobic [Meloxicam]   ?  diarrhea  ? ?Social History  ? ?Socioeconomic History  ? Marital status: Married  ?  Spouse name: Claiborne Billings  ? Number of children: 2  ? Years of education: Not on file  ? Highest education level: Not on file  ?Occupational History  ? Not on file  ?Tobacco Use  ? Smoking status: Never  ? Smokeless tobacco: Never   ?Substance and Sexual Activity  ? Alcohol use: No  ? Drug use: No  ? Sexual activity: Not on file  ?Other Topics Concern  ? Not on file  ?Social History Narrative  ? Married x 48 years in 2022.  ? 1 grandchild age 30  ? ?Social Determinants of Health  ? ?Financial Resource Strain: Low Risk   ? Difficulty of Paying Living Expenses: Not hard at all  ?Food Insecurity: No Food Insecurity  ? Worried About Charity fundraiser in the Last Year: Never true  ? Ran Out of Food in the Last Year: Never true  ?Transportation Needs: No Transportation Needs  ? Lack of Transportation (Medical): No  ? Lack of Transportation (Non-Medical): No  ?Physical Activity: Sufficiently Active  ? Days of Exercise per Week: 5 days  ? Minutes of Exercise per Session: 30 min  ?Stress: No Stress Concern Present  ? Feeling of Stress : Not at all  ?Social Connections: Socially Integrated  ? Frequency of Communication with Friends and Family: More than three times a week  ? Frequency of Social Gatherings with Friends and Family: More than three times a week  ? Attends Religious Services: More than 4 times per year  ? Active Member of Clubs or Organizations: Yes  ?  Attends Archivist Meetings: More than 4 times per year  ? Marital Status: Married  ?Intimate Partner Violence: Not At Risk  ? Fear of Current or Ex-Partner: No  ? Emotionally Abused: No  ? Physically Abused: No  ? Sexually Abused: No  ? ?Family History  ?Problem Relation Age of Onset  ? Heart disease Mother   ? Hypertension Mother   ? Arthritis Mother   ? Heart disease Father   ? ? ? ? ?Review of Systems  ?All other systems reviewed and are negative. ? ?   ?Objective:  ? Physical Exam ?Vitals reviewed.  ?Constitutional:   ?   General: She is not in acute distress. ?   Appearance: Normal appearance. She is well-developed. She is not ill-appearing, toxic-appearing or diaphoretic.  ?HENT:  ?   Head: Normocephalic and atraumatic.  ?   Right Ear: Tympanic membrane, ear canal and  external ear normal.  ?   Left Ear: Tympanic membrane, ear canal and external ear normal.  ?   Nose: Congestion and rhinorrhea present.  ?   Mouth/Throat:  ?   Pharynx: No oropharyngeal exudate or posterior oropharyngeal erythema.  ?Eyes:  ?   General: No scleral icterus.    ?   Right eye: No discharge.     ?   Left eye: No discharge.  ?   Conjunctiva/sclera: Conjunctivae normal.  ?   Pupils: Pupils are equal, round, and reactive to light.  ?Neck:  ?   Thyroid: No thyromegaly.  ?   Vascular: No JVD.  ?   Trachea: No tracheal deviation.  ?Cardiovascular:  ?   Rate and Rhythm: Normal rate and regular rhythm.  ?   Heart sounds: Normal heart sounds. No murmur heard. ?  No friction rub. No gallop.  ?Pulmonary:  ?   Effort: Pulmonary effort is normal. No respiratory distress.  ?   Breath sounds: Normal breath sounds. No stridor. No wheezing or rales.  ?Chest:  ?   Chest wall: No tenderness.  ?Abdominal:  ?   General: Abdomen is flat. There is no distension.  ?   Palpations: Abdomen is soft.  ?   Tenderness: There is abdominal tenderness. There is no guarding or rebound.  ?Musculoskeletal:  ?   Cervical back: Normal range of motion and neck supple.  ?Lymphadenopathy:  ?   Cervical: No cervical adenopathy.  ?Skin: ?   General: Skin is warm.  ?   Findings: No rash.  ?Neurological:  ?   Mental Status: She is alert and oriented to person, place, and time.  ?   Cranial Nerves: No cranial nerve deficit.  ?   Motor: No abnormal muscle tone.  ?   Coordination: Coordination normal.  ?   Deep Tendon Reflexes: Reflexes are normal and symmetric.  ? ? ? ? ? ?   ?Assessment & Plan:  ?LLQ abdominal pain ?Sinusitis ?I believe the patient may have early diverticulitis.  Begin Augmentin 875 mg twice daily for 10 days.  This would also cover sinusitis.  Take align 1 pill daily as a probiotic to help prevent diarrhea from the Augmentin.  If the symptoms of both go away over the next 10 days no further treatment is necessary.  Then the patient  can discontinue the align.  If the sinusitis symptoms persist, we can add Singulair. ?

## 2021-12-05 ENCOUNTER — Other Ambulatory Visit: Payer: Self-pay | Admitting: Family Medicine

## 2022-02-01 ENCOUNTER — Encounter: Payer: Self-pay | Admitting: Family Medicine

## 2022-02-01 ENCOUNTER — Ambulatory Visit: Payer: Medicare PPO | Admitting: Family Medicine

## 2022-02-01 VITALS — BP 128/72 | HR 66 | Temp 97.8°F | Ht 64.0 in | Wt 146.0 lb

## 2022-02-01 DIAGNOSIS — R1032 Left lower quadrant pain: Secondary | ICD-10-CM | POA: Diagnosis not present

## 2022-02-01 DIAGNOSIS — K5790 Diverticulosis of intestine, part unspecified, without perforation or abscess without bleeding: Secondary | ICD-10-CM | POA: Diagnosis not present

## 2022-02-01 NOTE — Progress Notes (Signed)
Acute Office Visit  Subjective:     Patient ID: Amy Dennis, female    DOB: Sep 11, 1950, 71 y.o.   MRN: [id number]  Chief Complaint  Patient presents with   Follow-up    having a flare up of diverticulitis    Abdominal Pain This is a new problem. The current episode started yesterday. The onset quality is sudden. The problem occurs intermittently. The problem has been resolved. The pain is located in the LLQ. The patient is experiencing no pain. Associated symptoms include frequency. Pertinent negatives include no diarrhea, fever, flatus, hematochezia, nausea or vomiting. Nothing aggravates the pain. The pain is relieved by Bowel movements. She has tried nothing for the symptoms.    Today's Vitals   02/01/22 1157  BP: 128/72  Pulse: 66  Temp: 97.8 F (36.6 C)  TempSrc: Oral  SpO2: 98%  Weight: 146 lb (66.2 kg)  Height: 5\' 4"  (1.626 m)   Body mass index is 25.06 kg/m.    Review of Systems  Constitutional:  Negative for fever.  Gastrointestinal:  Positive for abdominal pain. Negative for diarrhea, flatus, hematochezia, nausea and vomiting.  Genitourinary:  Positive for frequency.  All other systems reviewed and are negative.  Past Medical History:  Diagnosis Date   Allergy    Arthritis    Phreesia 03/17/2020   Hyperlipidemia    Hypertension    Past Surgical History:  Procedure Laterality Date   CESAREAN SECTION     Current Outpatient Medications on File Prior to Visit  Medication Sig Dispense Refill   CALCIUM-VITAMIN D PO Take by mouth.     fluticasone (FLONASE) 50 MCG/ACT nasal spray Use 2 spray(s) in each nostril once daily 48 g 3   hydrochlorothiazide (HYDRODIURIL) 25 MG tablet Take 1 tablet by mouth once daily 90 tablet 0   ibuprofen (ADVIL) 200 MG tablet Take 200 mg by mouth every 6 (six) hours as needed.     loratadine (CLARITIN) 10 MG tablet Take 10 mg by mouth daily.     simvastatin (ZOCOR) 40 MG tablet TAKE 1 TABLET BY MOUTH AT BEDTIME 90 tablet 0    No current facility-administered medications on file prior to visit.   Allergies  Allergen Reactions   Mobic [Meloxicam]     diarrhea        Objective:    BP 128/72   Pulse 66   Temp 97.8 F (36.6 C) (Oral)   Ht 5\' 4"  (1.626 m)   Wt 146 lb (66.2 kg)   SpO2 98%   BMI 25.06 kg/m    Physical Exam Vitals and nursing note reviewed.  Constitutional:      Appearance: Normal appearance. She is normal weight.  Abdominal:     General: Abdomen is flat. Bowel sounds are normal. There is no distension.     Palpations: Abdomen is soft.     Tenderness: There is no abdominal tenderness. There is no guarding or rebound.     Hernia: No hernia is present.  Neurological:     Mental Status: She is alert.     No results found for any visits on 02/01/22.      Assessment & Plan:   Problem List Items Addressed This Visit   None Visit Diagnoses     Diverticulosis    -  Primary   Left lower quadrant abdominal pain         Patients abdominal pain has resolved since the mild pain she was experiencing last night. She came  in today with concerns for progression that would require treatment while she is on vacation this weekend to [place], [place]. I reassured her that since her symptoms have resolved this does not warrant further investigation or antibiotics and asked her to call back if symptoms resurface and are severe, or are accompanied by nausea, vomiting, diarrhea, or fevers.  No orders of the defined types were placed in this encounter.   Return if symptoms worsen or fail to improve.  Park Meo, FNP

## 2022-02-01 NOTE — Progress Notes (Deleted)
Subjective:     Amy Dennis is a 71 y.o. female who presents for evaluation of abdominal pain. Onset was {numbers; 1-10:13787} {unit:19031} ago. Symptoms have been {course:17}. The pain is described as {quality:19151}, and is {0-10:5044}/10 in intensity. Pain is located in the {anatomy; location abdomen:19149} {abdomen pain radiation:616}.  Aggravating factors: {aggrav factors:619}.  Alleviating factors: {allev factors:620}. Associated symptoms: {assoc symptoms:621}. The patient denies {assoc symptoms:19032}.  {Misc; AMB Common SmartLinks:21383::"The patient's history has been marked as reviewed and updated as appropriate."}  Review of Systems {ros - complete:30496}     Objective:    {Exam, Complete:17964}    Assessment:    Abdominal pain, likely secondary to *** .    Plan:    {abd pain treatment plan:14425}

## 2022-02-01 NOTE — Progress Notes (Deleted)
  Subjective:     Patient ID: Amy Dennis, female   DOB: [birth year], 71 y.o.   MRN: [id number]  HPI   Review of Systems     Objective:   Physical Exam     Assessment:     ***    Plan:     ***

## 2022-03-03 ENCOUNTER — Other Ambulatory Visit: Payer: Self-pay | Admitting: Family Medicine

## 2022-03-03 NOTE — Telephone Encounter (Signed)
Requested medication (s) are due for refll today: yes  Requested medication (s) are on the active medication list: yes  Last refill:  12/05/21 #90 with 0 RF  Future visit scheduled: 04/14/22, seen 09/27/21  Notes to clinic:  Failed protocol of labs within 12 months, lab from 01/27/2021, has upcoming appt, please assess.       Requested Prescriptions  Pending Prescriptions Disp Refills   simvastatin (ZOCOR) 40 MG tablet [Pharmacy Med Name: Simvastatin 40 MG Oral Tablet] 90 tablet 0    Sig: TAKE 1 TABLET BY MOUTH AT BEDTIME     Cardiovascular:  Antilipid - Statins Failed - 03/03/2022 11:05 AM      Failed - Lipid Panel in normal range within the last 12 months    Cholesterol  Date Value Ref Range Status  01/27/2021 180 <200 mg/dL Final   LDL Cholesterol (Calc)  Date Value Ref Range Status  01/27/2021 95 mg/dL (calc) Final    Comment:    Reference range: <100 . Desirable range <100 mg/dL for primary prevention;   <70 mg/dL for patients with CHD or diabetic patients  with > or = 2 CHD risk factors. Marland Kitchen LDL-C is now calculated using the Martin-Hopkins  calculation, which is a validated novel method providing  better accuracy than the Friedewald equation in the  estimation of LDL-C.  Cresenciano Genre et al. Annamaria Helling. 0998;338(25): 2061-2068  (http://education.[facility].com/faq/FAQ164)    HDL  Date Value Ref Range Status  01/27/2021 69 > OR = 50 mg/dL Final   Triglycerides  Date Value Ref Range Status  01/27/2021 70 <150 mg/dL Final         Passed - Patient is not pregnant      Passed - Valid encounter within last 12 months    Recent Outpatient Visits           5 months ago LLQ abdominal pain   [place] [facility] Schaumann, Cammie Mcgee, MD   1 year ago Benign essential HTN   [place] [facility] Schaumann, Cammie Mcgee, MD   1 year ago Routine general medical examination at a health care facility   [place], Cammie Mcgee, MD   3 years ago  Routine general medical examination at a health care facility   [place], Cammie Mcgee, MD   3 years ago Benign essential HTN   [place], Cammie Mcgee, MD

## 2022-03-21 DIAGNOSIS — L570 Actinic keratosis: Secondary | ICD-10-CM | POA: Diagnosis not present

## 2022-03-21 DIAGNOSIS — L814 Other melanin hyperpigmentation: Secondary | ICD-10-CM | POA: Diagnosis not present

## 2022-03-21 DIAGNOSIS — D1801 Hemangioma of skin and subcutaneous tissue: Secondary | ICD-10-CM | POA: Diagnosis not present

## 2022-03-21 DIAGNOSIS — Z85828 Personal history of other malignant neoplasm of skin: Secondary | ICD-10-CM | POA: Diagnosis not present

## 2022-03-21 DIAGNOSIS — D0462 Carcinoma in situ of skin of left upper limb, including shoulder: Secondary | ICD-10-CM | POA: Diagnosis not present

## 2022-03-21 DIAGNOSIS — C44519 Basal cell carcinoma of skin of other part of trunk: Secondary | ICD-10-CM | POA: Diagnosis not present

## 2022-04-14 ENCOUNTER — Ambulatory Visit (INDEPENDENT_AMBULATORY_CARE_PROVIDER_SITE_OTHER): Payer: Medicare PPO

## 2022-04-14 DIAGNOSIS — Z0000 Encounter for general adult medical examination without abnormal findings: Secondary | ICD-10-CM | POA: Diagnosis not present

## 2022-04-14 NOTE — Progress Notes (Signed)
Subjective:   Amy Dennis is a 71 y.o. female who presents for an Initial Medicare Annual Wellness Visit.  Review of Systems     Cardiac Risk Factors include: advanced age (>34men, >70 women);family history of premature cardiovascular disease     Objective:    Today's Vitals   04/14/22 0901  PainSc: 6    There is no height or weight on file to calculate BMI.     04/14/2022    9:15 AM 04/01/2021    9:13 AM 03/18/2020    9:33 AM  Advanced Directives  Does Patient Have a Medical Advance Directive? Yes Yes Yes  Type of Estate agent of [place];Living will Healthcare Power of [place];Living will Healthcare Power of [place];Living will;Out of facility DNR (pink MOST or yellow form)  Copy of Healthcare Power of Attorney in Chart?  No - copy requested   Would patient like information on creating a medical advance directive?  No - Patient declined     Current Medications (verified) Outpatient Encounter Medications as of 04/14/2022  Medication Sig   CALCIUM-VITAMIN D PO Take by mouth.   fluticasone (FLONASE) 50 MCG/ACT nasal spray Use 2 spray(s) in each nostril once daily   hydrochlorothiazide (HYDRODIURIL) 25 MG tablet Take 1 tablet by mouth once daily   ibuprofen (ADVIL) 200 MG tablet Take 200 mg by mouth every 6 (six) hours as needed.   simvastatin (ZOCOR) 40 MG tablet TAKE 1 TABLET BY MOUTH AT BEDTIME   loratadine (CLARITIN) 10 MG tablet Take 10 mg by mouth daily.   No facility-administered encounter medications on file as of 04/14/2022.    Allergies (verified) Mobic [meloxicam]   History: Past Medical History:  Diagnosis Date   Allergy    Arthritis    Phreesia 03/17/2020   Hyperlipidemia    Hypertension    Past Surgical History:  Procedure Laterality Date   CESAREAN SECTION     Family History  Problem Relation Age of Onset   Heart disease Mother    Hypertension Mother    Arthritis Mother    Heart disease Father    Social History    Socioeconomic History   Marital status: Married    Spouse name: Tresa Endo   Number of children: 2   Years of education: Not on file   Highest education level: Not on file  Occupational History   Not on file  Tobacco Use   Smoking status: Never   Smokeless tobacco: Never  Substance and Sexual Activity   Alcohol use: No   Drug use: No   Sexual activity: Not on file  Other Topics Concern   Not on file  Social History Narrative   Married x 48 years in 2022.   1 grandchild age 75   Social Determinants of Health   Financial Resource Strain: Low Risk  (04/14/2022)   Overall Financial Resource Strain (CARDIA)    Difficulty of Paying Living Expenses: Not hard at all  Food Insecurity: No Food Insecurity (04/14/2022)   Hunger Vital Sign    Worried About Running Out of Food in the Last Year: Never true    Ran Out of Food in the Last Year: Never true  Transportation Needs: No Transportation Needs (04/01/2021)   PRAPARE - Administrator, Civil Service (Medical): No    Lack of Transportation (Non-Medical): No  Physical Activity: Sufficiently Active (04/14/2022)   Exercise Vital Sign    Days of Exercise per Week: 5 days  Minutes of Exercise per Session: 30 min  Stress: No Stress Concern Present (04/14/2022)   [employer] - Occupational Stress Questionnaire    Feeling of Stress : Only a little  Social Connections: Socially Integrated (04/14/2022)   Social Connection and Isolation Panel [NHANES]    Frequency of Communication with Friends and Family: More than three times a week    Frequency of Social Gatherings with Friends and Family: More than three times a week    Attends Religious Services: 1 to 4 times per year    Active Member of [employer]ubs or Organizations: Yes    Attends BankerClub or Organization Meetings: 1 to 4 times per year    Marital Status: Married    Tobacco Counseling Counseling given: Not Answered   Clinical Intake:  Pre-visit  preparation completed: Yes  Pain : 0-10 (hip flexer bothers her) Pain Score: 6  Pain Type: Chronic pain (been about 3 yrs w/hip flexer pain) Pain Location: Hip Pain Orientation: Right Pain Descriptors / Indicators: Sharp, Throbbing, Dull, Aching Pain Onset: Other (comment) Pain Frequency: Intermittent (depends on what pt is doing) Pain Relieving Factors: tylenol  Pain Relieving Factors: tylenol  BMI - recorded: 25.5 Nutritional Status: BMI 25 -29 Overweight Diabetes: No  How often do you need to have someone help you when you read instructions, pamphlets, or other written materials from your doctor or pharmacy?: 1 - Never What is the last grade level you completed in school?: masters degree  Diabetic?n/a  Interpreter Needed?: No      Activities of Daily Living    04/14/2022    9:16 AM 04/13/2022   10:11 PM  In your present state of health, do you have any difficulty performing the following activities:  Hearing? 0 0  Vision? 0 0  Difficulty concentrating or making decisions? 0 0  Walking or climbing stairs? 1 0  Comment hip flexer   Dressing or bathing? 0 0  Doing errands, shopping? 0 0  Preparing Food and eating ? N N  Using the Toilet? N N  In the past six months, have you accidently leaked urine? N N  Do you have problems with loss of bowel control? N N  Managing your Medications? N N  Managing your Finances? N N  Housekeeping or managing your Housekeeping? N N    Patient Care Team: Donita BrooksPickard, Warren T, MD as PCP - General (Family Medicine) Jodelle Redhristopher, Bridgette, MD as PCP - Cardiology (Cardiology)  Indicate any recent Medical Services you may have received from other than [facility] providers in the past year (date may be approximate).     Assessment:   This is a routine wellness examination for Kathie RhodesBetty.  Hearing/Vision screen No results found.  Dietary issues and exercise activities discussed: Current Exercise Habits: Home exercise routine;Structured  exercise class (walking the dogs/stair master/working outside), Type of exercise: Other - see comments;walking;treadmill (walking the dogs/stair master/working outside), Time (Minutes): 30, Frequency (Times/Week): 5, Weekly Exercise (Minutes/Week): 150, Intensity: Mild, Exercise limited by: None identified   Goals Addressed             This Visit's Progress    Weight (lb) < 200 lb (90.7 kg)       Loose 10 lbs/continue to be active and walk 10,000 steps/being positive       Depression Screen    04/14/2022    9:14 AM 04/01/2021    9:07 AM 03/18/2020    9:33 AM 02/25/2019    3:09 PM 10/10/2016  8:32 AM 09/12/2016   12:38 PM 08/19/2013    9:15 AM  PHQ 2/9 Scores  PHQ - 2 Score 0 0 0 0 0 0 0  PHQ- 9 Score    0  0     Fall Risk    04/13/2022   10:11 PM 02/01/2022   11:54 AM 04/01/2021    9:14 AM 03/18/2020    9:33 AM 02/25/2019    3:08 PM  Fall Risk   Falls in the past year? 0 0 0 0 0  Number falls in past yr:  0 0 0 0  Injury with Fall?  0 0 0   Risk for fall due to :   No Fall Risks    Follow up   Falls prevention discussed  Falls evaluation completed    FALL RISK PREVENTION PERTAINING TO THE HOME:  Any stairs in or around the home? Yes  If so, are there any without handrails? Yes  Home free of loose throw rugs in walkways, pet beds, electrical cords, etc? Yes  Adequate lighting in your home to reduce risk of falls? Yes   ASSISTIVE DEVICES UTILIZED TO PREVENT FALLS:  Life alert?  N/a Use of a cane, walker or w/c? No  Grab bars in the bathroom? No  Shower chair or bench in shower? Yes  Elevated toilet seat or a handicapped toilet? No   TIMED UP AND GO:  Was the test performed? No .  Length of time to ambulate 10 feet: n/a sec.     Cognitive Function:        04/14/2022    9:23 AM 03/18/2020    9:34 AM  6CIT Screen  What Year? 0 points 0 points  What month? 0 points 0 points  What time? 0 points 0 points  Count back from 20 0 points 0 points  Months in  reverse 0 points 0 points  Repeat phrase 0 points 0 points  Total Score 0 points 0 points    Immunizations Immunization History  Administered Date(s) Administered   Fluad Quad(high Dose 65+) 02/25/2019, 02/18/2020, 01/27/2021   Influenza Whole 02/13/2008   Influenza, High Dose Seasonal PF 02/28/2017   Influenza,inj,Quad PF,6+ Mos 03/27/2013, 02/12/2014, 03/02/2015, 02/29/2016, 03/12/2018   PFIZER Comirnaty(Gray Top)Covid-19 Tri-Sucrose Vaccine 12/08/2020   PFIZER(Purple Top)SARS-COV-2 Vaccination 06/07/2019, 06/28/2019, 02/04/2020   Pneumococcal Conjugate-13 04/29/2018   Pneumococcal Polysaccharide-23 09/12/2016   Tdap 08/19/2013   Zoster, Live 07/11/2012    TDAP status: Due, Education has been provided regarding the importance of this vaccine. Advised may receive this vaccine at local pharmacy or Health Dept. Aware to provide a copy of the vaccination record if obtained from local pharmacy or Health Dept. Verbalized acceptance and understanding.  Flu Vaccine status: Up to date  Pneumococcal vaccine status: Declined,  Education has been provided regarding the importance of this vaccine but patient still declined. Advised may receive this vaccine at local pharmacy or Health Dept. Aware to provide a copy of the vaccination record if obtained from local pharmacy or Health Dept. Verbalized acceptance and understanding.   Covid-19 vaccine status: Completed vaccines  Qualifies for Shingles Vaccine? Yes   Zostavax completed No   Shingrix Completed?: No.    Education has been provided regarding the importance of this vaccine. Patient has been advised to call insurance company to determine out of pocket expense if they have not yet received this vaccine. Advised may also receive vaccine at local pharmacy or Health Dept. Verbalized acceptance and understanding.  Screening  Tests Health Maintenance  Topic Date Due   COVID-19 Vaccine (5 - 2023-24 season) 04/30/2022 (Originally 01/13/2022)    Zoster Vaccines- Shingrix (1 of 2) 07/14/2022 (Originally 04/04/2001)   INFLUENZA VACCINE  08/13/2022 (Originally 12/13/2021)   Medicare Annual Wellness (AWV)  04/15/2023   MAMMOGRAM  05/14/2023   DTaP/Tdap/Td (2 - Td or Tdap) 08/20/2023   COLONOSCOPY (Pts 45-56yrs Insurance coverage will need to be confirmed)  02/13/2028   Pneumonia Vaccine 58+ Years old  Completed   DEXA SCAN  Completed   HPV VACCINES  Aged Out   Hepatitis C Screening  Discontinued    Health Maintenance  There are no preventive care reminders to display for this patient.   Colorectal cancer screening: Type of screening: Colonoscopy. Completed 10/19. Repeat every 10 years  Mammogram status: Completed 12/22. Repeat every year 2  Dexa Scan: 2/21 Repeat in 1 year  Lung Cancer Screening: (Low Dose CT Chest recommended if Age 18-80 years, 30 pack-year currently smoking OR have quit w/in 15years.) does not qualify.   Lung Cancer Screening Referral: n/a  Additional Screening:  Hepatitis C Screening: does not qualify; Completed n/a  Vision Screening: Recommended annual ophthalmology exams for early detection of glaucoma and other disorders of the eye. Is the patient up to date with their annual eye exam?  Yes  Who is the provider or what is the name of the office in which the patient attends annual eye exams? Spring 2023 If pt is not established with a provider, would they like to be referred to a provider to establish care? No .   Dental Screening: Recommended annual dental exams for proper oral hygiene  Community Resource Referral / Chronic Care Management: CRR required this visit?  No   CCM required this visit?  No      Plan:     I have personally reviewed and noted the following in the patient's chart:   Medical and social history Use of alcohol, tobacco or illicit drugs  Current medications and supplements including opioid prescriptions. Patient is not currently taking opioid prescriptions. Functional  ability and status Nutritional status Physical activity Advanced directives List of other physicians Hospitalizations, surgeries, and ER visits in previous 12 months Vitals Screenings to include cognitive, depression, and falls Referrals and appointments  In addition, I have reviewed and discussed with patient certain preventive protocols, quality metrics, and best practice recommendations. A written personalized care plan for preventive services as well as general preventive health recommendations were provided to patient.     Arta Silence, CMA   04/14/2022   Nurse Notes: n/a

## 2022-04-25 ENCOUNTER — Ambulatory Visit: Payer: Medicare PPO | Admitting: Family Medicine

## 2022-04-25 ENCOUNTER — Encounter: Payer: Self-pay | Admitting: Family Medicine

## 2022-04-25 VITALS — BP 142/80 | HR 73 | Ht 64.0 in | Wt 149.0 lb

## 2022-04-25 DIAGNOSIS — I10 Essential (primary) hypertension: Secondary | ICD-10-CM | POA: Diagnosis not present

## 2022-04-25 DIAGNOSIS — M25551 Pain in right hip: Secondary | ICD-10-CM

## 2022-04-25 MED ORDER — DICLOFENAC SODIUM 75 MG PO TBEC: 75.0000 mg | DELAYED_RELEASE_TABLET | Freq: Two times a day (BID) | ORAL | 0 refills | Status: DC

## 2022-04-25 NOTE — Progress Notes (Signed)
Subjective:    Patient ID: Amy Dennis, female    DOB: February 28, 1951, 71 y.o.   MRN: [id number]  Patient presents today complaining of pain in her right hip.  She has decreased hip flexion range of motion.  She also has decreased external rotation.  She is unable to cross her legs to put on her shoes in the morning.  She is also reporting anterior hip pain and lateral hip pain.  She is overdue for fasting lab work. Past Medical History:  Diagnosis Date   Allergy    Arthritis    Phreesia 03/17/2020   Hyperlipidemia    Hypertension    Past Surgical History:  Procedure Laterality Date   CESAREAN SECTION     Current Outpatient Medications on File Prior to Visit  Medication Sig Dispense Refill   CALCIUM-VITAMIN D PO Take by mouth.     fluticasone (FLONASE) 50 MCG/ACT nasal spray Use 2 spray(s) in each nostril once daily 48 g 3   hydrochlorothiazide (HYDRODIURIL) 25 MG tablet Take 1 tablet by mouth once daily 90 tablet 0   ibuprofen (ADVIL) 200 MG tablet Take 200 mg by mouth every 6 (six) hours as needed.     loratadine (CLARITIN) 10 MG tablet Take 10 mg by mouth daily.     simvastatin (ZOCOR) 40 MG tablet TAKE 1 TABLET BY MOUTH AT BEDTIME 90 tablet 0   No current facility-administered medications on file prior to visit.   Allergies  Allergen Reactions   Mobic [Meloxicam]     diarrhea   Social History   Socioeconomic History   Marital status: Married    Spouse name: Tresa Endo   Number of children: 2   Years of education: Not on file   Highest education level: Not on file  Occupational History   Not on file  Tobacco Use   Smoking status: Never   Smokeless tobacco: Never  Substance and Sexual Activity   Alcohol use: No   Drug use: No   Sexual activity: Not on file  Other Topics Concern   Not on file  Social History Narrative   Married x 48 years in 2022.   1 grandchild age 47   Social Determinants of Health   Financial Resource Strain: Low Risk  (04/14/2022)   Overall  Financial Resource Strain (CARDIA)    Difficulty of Paying Living Expenses: Not hard at all  Food Insecurity: No Food Insecurity (04/14/2022)   Hunger Vital Sign    Worried About Running Out of Food in the Last Year: Never true    Ran Out of Food in the Last Year: Never true  Transportation Needs: No Transportation Needs (04/01/2021)   PRAPARE - Administrator, Civil Service (Medical): No    Lack of Transportation (Non-Medical): No  Physical Activity: Sufficiently Active (04/14/2022)   Exercise Vital Sign    Days of Exercise per Week: 5 days    Minutes of Exercise per Session: 30 min  Stress: No Stress Concern Present (04/14/2022)   [employer] of Occupational Health - Occupational Stress Questionnaire    Feeling of Stress : Only a little  Social Connections: Socially Integrated (04/14/2022)   Social Connection and Isolation Panel [NHANES]    Frequency of Communication with Friends and Family: More than three times a week    Frequency of Social Gatherings with Friends and Family: More than three times a week    Attends Religious Services: 1 to 4 times per year  Active Member of Clubs or Organizations: Yes    Attends Banker Meetings: 1 to 4 times per year    Marital Status: Married  Catering manager Violence: Not At Risk (04/14/2022)   Humiliation, Afraid, Rape, and Kick questionnaire    Fear of Current or Ex-Partner: No    Emotionally Abused: No    Physically Abused: No    Sexually Abused: No   Family History  Problem Relation Age of Onset   Heart disease Mother    Hypertension Mother    Arthritis Mother    Heart disease Father       Review of Systems  All other systems reviewed and are negative.     Objective:   Physical Exam Vitals reviewed.  Constitutional:      General: She is not in acute distress.    Appearance: Normal appearance. She is well-developed. She is not ill-appearing, toxic-appearing or diaphoretic.  HENT:     Head:  Normocephalic and atraumatic.     Right Ear: Tympanic membrane, ear canal and external ear normal.     Left Ear: Tympanic membrane, ear canal and external ear normal.     Nose: Congestion and rhinorrhea present.     Mouth/Throat:     Pharynx: No oropharyngeal exudate or posterior oropharyngeal erythema.  Eyes:     General: No scleral icterus.       Right eye: No discharge.        Left eye: No discharge.     Conjunctiva/sclera: Conjunctivae normal.     Pupils: Pupils are equal, round, and reactive to light.  Neck:     Thyroid: No thyromegaly.     Vascular: No JVD.     Trachea: No tracheal deviation.  Cardiovascular:     Rate and Rhythm: Normal rate and regular rhythm.     Heart sounds: Normal heart sounds. No murmur heard.    No friction rub. No gallop.  Pulmonary:     Effort: Pulmonary effort is normal. No respiratory distress.     Breath sounds: Normal breath sounds. No stridor. No wheezing or rales.  Chest:     Chest wall: No tenderness.  Abdominal:     General: Abdomen is flat. There is no distension.     Palpations: Abdomen is soft.     Tenderness: There is no guarding or rebound.  Musculoskeletal:     Cervical back: Normal range of motion and neck supple.     Right hip: Tenderness present. Decreased range of motion.  Lymphadenopathy:     Cervical: No cervical adenopathy.  Skin:    General: Skin is warm.     Findings: No rash.  Neurological:     Mental Status: She is alert and oriented to person, place, and time.     Cranial Nerves: No cranial nerve deficit.     Motor: No abnormal muscle tone.     Coordination: Coordination normal.     Deep Tendon Reflexes: Reflexes are normal and symmetric.           Assessment & Plan:  Benign essential HTN - Plan: CBC with Differential/Platelet, Lipid panel, COMPLETE METABOLIC PANEL WITH GFR  Right hip pain - Plan: DG Hip Unilat W OR W/O Pelvis 2-3 Views Right While the patient is here today and when to check fasting lab  work including a CBC a CMP and a lipid panel.  Her blood pressure is well-controlled.  I believe that she likely has arthritis in the right hip.  I will get an x-ray of her right hip.  Start diclofenac 75 mg twice daily.  Consider physical therapy if not improving

## 2022-04-26 LAB — COMPLETE METABOLIC PANEL WITH GFR
AG Ratio: 1.6 (calc) (ref 1.0–2.5)
ALT: 18 U/L (ref 6–29)
AST: 18 U/L (ref 10–35)
Albumin: 4.1 g/dL (ref 3.6–5.1)
Alkaline phosphatase (APISO): 57 U/L (ref 37–153)
BUN: 10 mg/dL (ref 7–25)
CO2: 30 mmol/L (ref 20–32)
Calcium: 8.8 mg/dL (ref 8.6–10.4)
Chloride: 102 mmol/L (ref 98–110)
Creat: 0.65 mg/dL (ref 0.60–1.00)
Globulin: 2.6 g/dL (calc) (ref 1.9–3.7)
Glucose, Bld: 101 mg/dL — ABNORMAL HIGH (ref 65–99)
Potassium: 4 mmol/L (ref 3.5–5.3)
Sodium: 140 mmol/L (ref 135–146)
Total Bilirubin: 0.3 mg/dL (ref 0.2–1.2)
Total Protein: 6.7 g/dL (ref 6.1–8.1)
eGFR: 94 mL/min/{1.73_m2} (ref >=60)

## 2022-04-26 LAB — CBC WITH DIFFERENTIAL/PLATELET
Absolute Monocytes: 385 cells/uL (ref 200–950)
Basophils Absolute: 40 cells/uL (ref 0–200)
Basophils Relative: 0.8 %
Eosinophils Absolute: 160 cells/uL (ref 15–500)
Eosinophils Relative: 3.2 %
HCT: 36 % (ref 35.0–45.0)
Hemoglobin: 12.1 g/dL (ref 11.7–15.5)
Lymphs Abs: 825 cells/uL — ABNORMAL LOW (ref 850–3900)
MCH: 28.8 pg (ref 27.0–33.0)
MCHC: 33.6 g/dL (ref 32.0–36.0)
MCV: 85.7 fL (ref 80.0–100.0)
MPV: 12.5 fL (ref 7.5–12.5)
Monocytes Relative: 7.7 %
Neutro Abs: 3590 cells/uL (ref 1500–7800)
Neutrophils Relative %: 71.8 %
Platelets: 177 10*3/uL (ref 140–400)
RBC: 4.2 10*6/uL (ref 3.80–5.10)
RDW: 13.1 % (ref 11.0–15.0)
Total Lymphocyte: 16.5 %
WBC: 5 10*3/uL (ref 3.8–10.8)

## 2022-04-26 LAB — LIPID PANEL
Cholesterol: 155 mg/dL (ref <200)
HDL: 73 mg/dL (ref >=50)
LDL Cholesterol (Calc): 68 mg/dL (calc)
Non-HDL Cholesterol (Calc): 82 mg/dL (calc) (ref <130)
Total CHOL/HDL Ratio: 2.1 (calc) (ref <5.0)
Triglycerides: 62 mg/dL (ref <150)

## 2022-04-26 IMAGING — CT CT CARDIAC CORONARY ARTERY CALCIUM SCORE
3 series · 14 of 20 positions shown, 15 images · non-contrast
Comparison: None.
COMPARISON: None.

Addendum:
EXAM:
OVER-READ INTERPRETATION  CT CHEST

The following report is an over-read performed by radiologist Dr.
Bielai Ribikauskas [REDACTED] on 07/16/2020. This over-read
does not include interpretation of cardiac or coronary anatomy or
pathology. The calcium score interpretation by the cardiologist is
attached.
CLINICAL DATA: Risk stratification
Coronary Calcium Score
TECHNIQUE: The patient was scanned on a Siemens Force scanner. Axial
non-contrast 3 mm slices were carried out through the heart. The
data set was analyzed on a dedicated work station and scored using
the Agatson method.

[Series 2: casc 3.0 bv41 2 bestdiast 66 % · axial · 0.38mm/px · z∈[-211,-130]mm · 4 of 46 slices shown, 5 images]
[im 10/46  vessel]
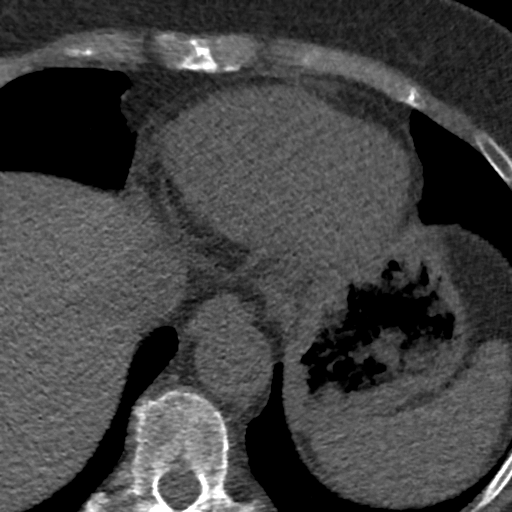
[im 10/46  lung]
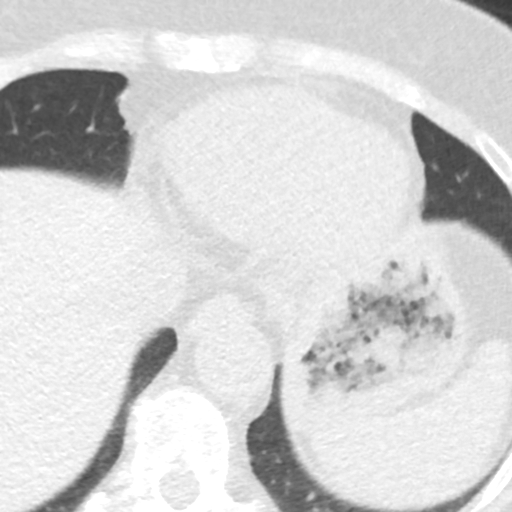
[im 19/46  vessel]
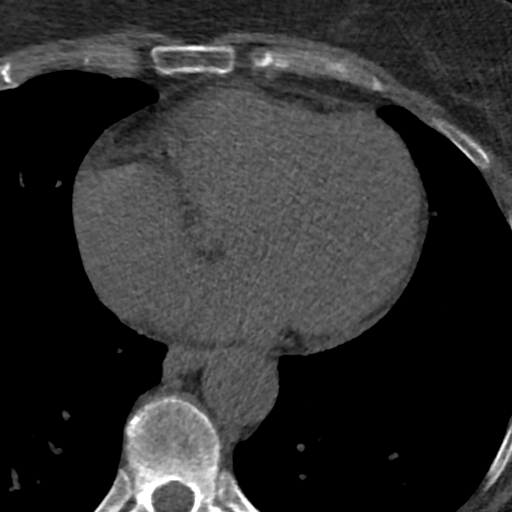
[im 28/46  vessel]
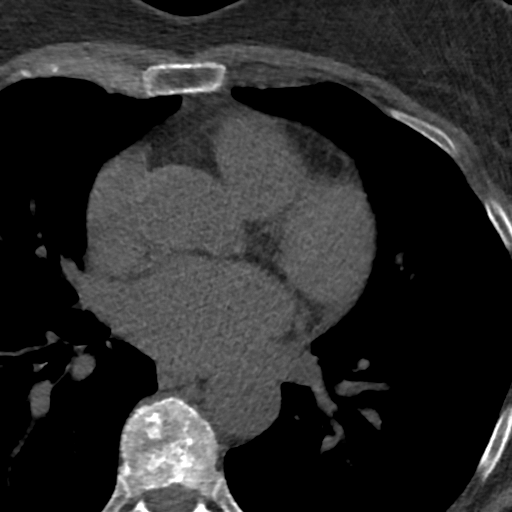
[im 37/46  vessel]
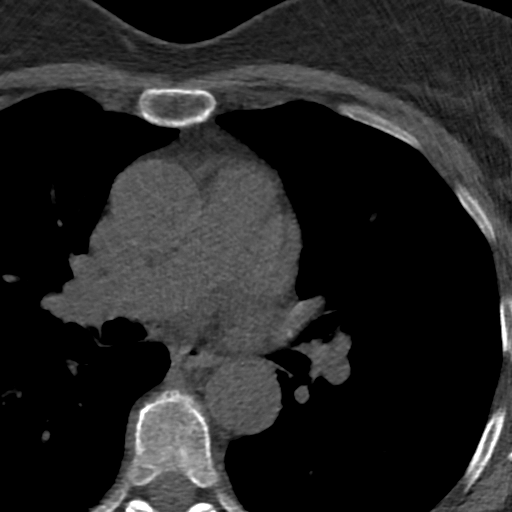

[Series 3: lung 68 % · axial · 0.66mm/px · z∈[-216,-126]mm · 5 of 46 slices shown]
[im 8/46  lung]
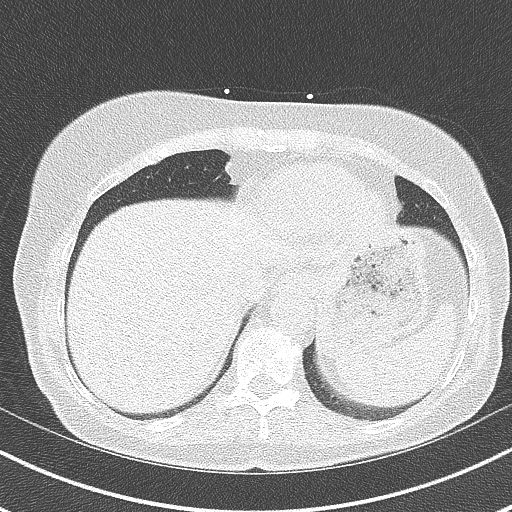
[im 16/46  lung]
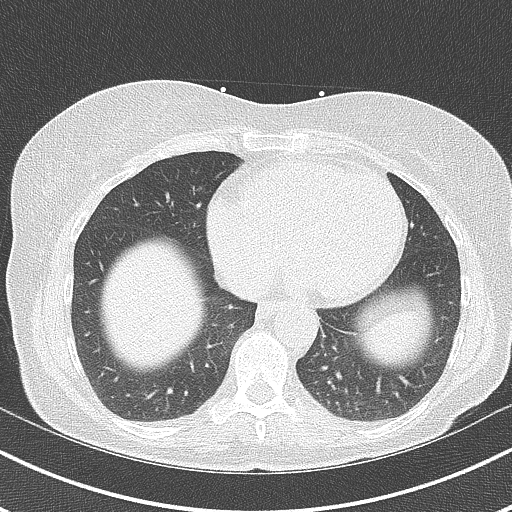
[im 23/46  lung]
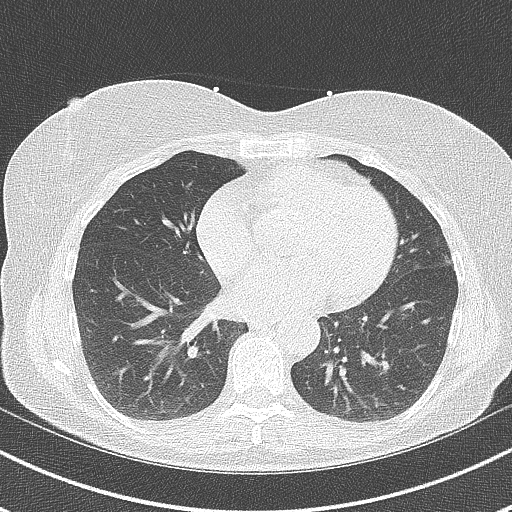
[im 31/46  lung]
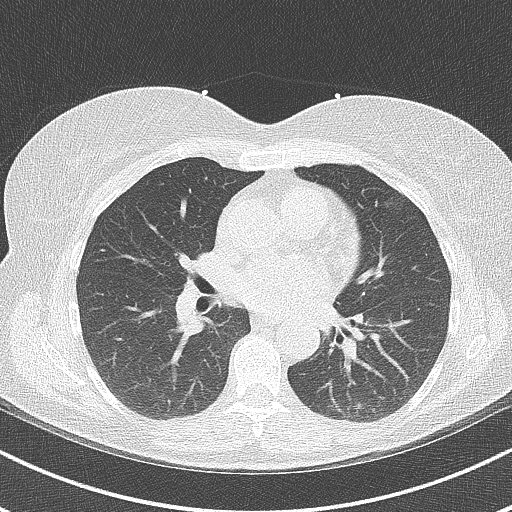
[im 38/46  lung]
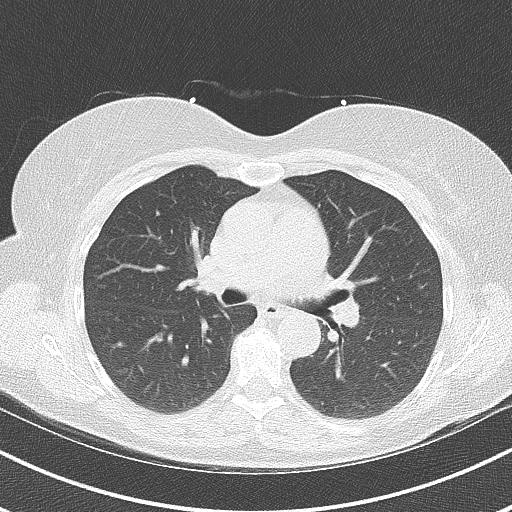

[Series 4: lung st 68 % · axial · 0.66mm/px · z∈[-216,-126]mm · 5 of 46 slices shown]
[im 8/46  lung]
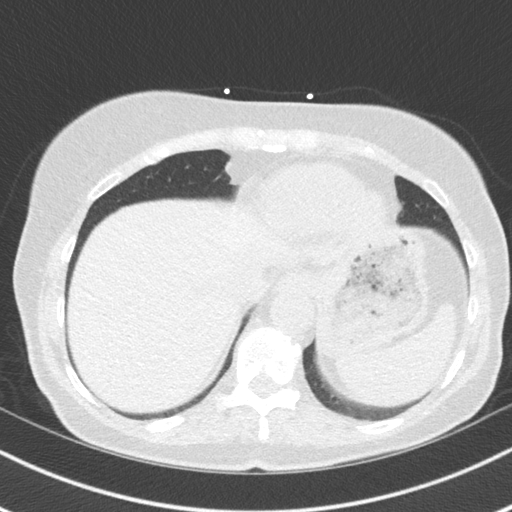
[im 16/46  lung]
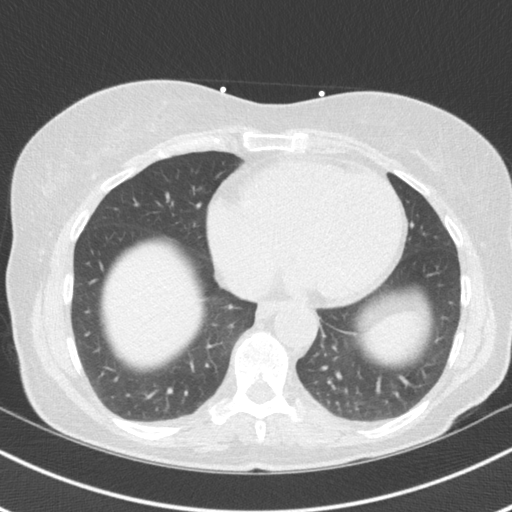
[im 23/46  lung]
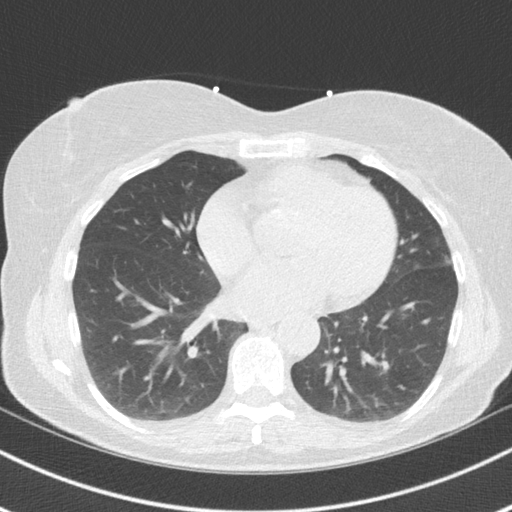
[im 31/46  lung]
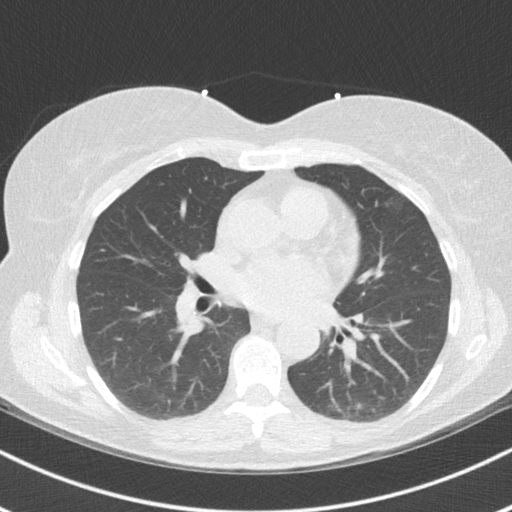
[im 38/46  lung]
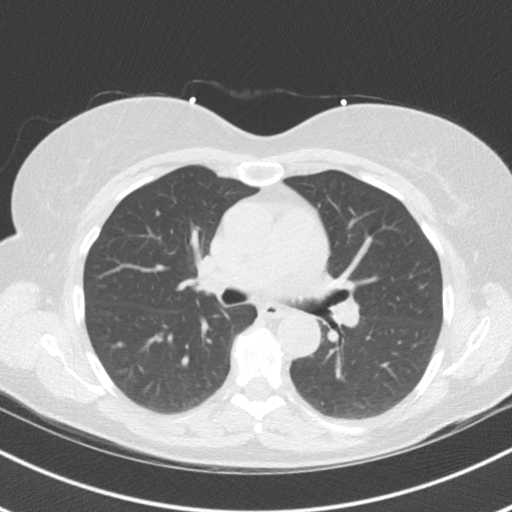

[14 of 20 positions shown; findings below may reference images not displayed]

FINDINGS: Vascular: Aortic atherosclerosis.

Mediastinum/Nodes: No imaged thoracic adenopathy.

Lungs/Pleura: No pleural fluid.  Clear imaged lungs.

Upper Abdomen: Normal imaged portions of the liver, spleen, stomach,
adrenal glands.

Musculoskeletal: No acute osseous abnormality. Upper thoracic
spondylosis. Mild convex right thoracic spine curvature.
IMPRESSION: 1.  No acute findings in the imaged extracardiac chest.
2.  Aortic Atherosclerosis (RQH7G-S3F.F).
FINDINGS: Non-cardiac: See separate report from [REDACTED].

Ascending Aorta: Normal caliber

Pericardium: Normal

Coronary arteries: Normal origins

Aortic annular calcification.
IMPRESSION: Coronary calcium score of 0. This is a low risk study.

Aortic atherosclerosis and annular calcification is noted.

*** End of Addendum ***
EXAM:
OVER-READ INTERPRETATION  CT CHEST

The following report is an over-read performed by radiologist Dr.
Bielai Ribikauskas [[email or web address]] on 07/16/2020. This over-read
does not include interpretation of cardiac or coronary anatomy or
pathology. The calcium score interpretation by the cardiologist is
attached.
FINDINGS: Vascular: Aortic atherosclerosis.

Mediastinum/Nodes: No imaged thoracic adenopathy.

Lungs/Pleura: No pleural fluid.  Clear imaged lungs.

Upper Abdomen: Normal imaged portions of the liver, spleen, stomach,
adrenal glands.

Musculoskeletal: No acute osseous abnormality. Upper thoracic
spondylosis. Mild convex right thoracic spine curvature.
IMPRESSION: 1.  No acute findings in the imaged extracardiac chest.
2.  Aortic Atherosclerosis ([id number]-S3F.F).

## 2022-04-27 ENCOUNTER — Ambulatory Visit
Admission: RE | Admit: 2022-04-27 | Discharge: 2022-04-27 | Disposition: A | Discharge status: Home / Self Care | Payer: Medicare PPO | Source: Ambulatory Visit | Attending: Family Medicine | Admitting: Family Medicine

## 2022-04-27 DIAGNOSIS — M25551 Pain in right hip: Secondary | ICD-10-CM | POA: Diagnosis not present

## 2022-05-01 ENCOUNTER — Other Ambulatory Visit: Payer: Self-pay

## 2022-05-01 DIAGNOSIS — M25551 Pain in right hip: Secondary | ICD-10-CM

## 2022-05-02 ENCOUNTER — Ambulatory Visit: Payer: Medicare PPO | Admitting: Sports Medicine

## 2022-05-02 ENCOUNTER — Ambulatory Visit: Payer: Self-pay

## 2022-05-02 DIAGNOSIS — M1611 Unilateral primary osteoarthritis, right hip: Secondary | ICD-10-CM

## 2022-05-02 DIAGNOSIS — M25551 Pain in right hip: Secondary | ICD-10-CM

## 2022-05-02 MED ORDER — LIDOCAINE HCL 1 % IJ SOLN
4.0000 mL | INTRAMUSCULAR | Status: AC | PRN
  Administered 2022-05-02: 4 mL

## 2022-05-02 MED ORDER — METHYLPREDNISOLONE ACETATE 40 MG/ML IJ SUSP
80.0000 mg | INTRAMUSCULAR | Status: AC | PRN
  Administered 2022-05-02: 80 mg via INTRA_ARTICULAR

## 2022-05-02 NOTE — Progress Notes (Signed)
Amy Dennis - 71 y.o. female MRN [id number]  Date of birth: 01-Aug-1950  Office Visit Note: Visit Date: 05/02/2022 PCP: Susy Frizzle, MD Referred by: Susy Frizzle, MD  Subjective: Chief Complaint  Patient presents with   Right Hip - Pain   HPI: Amy Dennis is a pleasant 71 y.o. female who presents today for acute on chronic right hip pain.  Patient reports she has noticed some catching and some intermittent pain over the right hip and right anterior hip for the last couple years, however over the last few weeks to 1 month she has noticed increasing her pain consistently.  She also notes stiffness and decreased range of motion about the right hip.  She saw her primary care physician for this who did obtain x-rays of the right hip and referral to orthopedics today.  She has not previously taken any medications, more than any home exercises or physical therapy for this in the past.  Last week she was started on oral diclofenac 75 mg twice daily.  This does help improve her pain to some extent, although this and stiffness are still present.  She is hoping to feel better during the holiday season and there is a lot of movement going around, and in the last few weeks she has had worsening of her pain and range of motion.  She states that years ago she was at [facility] when her and they got x-rays of the hip and states she had moderate arthritis of the hip.  -No diabetes or previous MI -No tobacco use -Appropriate weight, no obesity  Pertinent ROS were reviewed with the patient and found to be negative unless otherwise specified above in HPI.   Assessment & Plan: Visit Diagnoses:  1. Unilateral primary osteoarthritis, right hip   2. Pain in right hip    Plan: I did independently review her previous x-rays with her in the room ordered by her PCP. She does have severe osteoarthritis of the right hip with essentially bone-on-bone change.  She has had an exacerbation of her chronic  osteoarthritis of the hip over the last few weeks to month with worsening pain as well as limited range of motion and mobility.  She has not undergone much conservative treatment, other than just starting diclofenac.  She will continue her diclofenac 75 mg twice daily to be taken with food.  We also discussed home rehab exercises versus formalized physical therapy as well as injection therapy.  She would like to proceed with home rehab.  I did print out and reviewed hip stabilization exercises with her today to perform once daily.  She did wish to proceed with ultrasound-guided intra-articular hip injection, she tolerated this well today. Will use tylenol, ice, and diclofenac as needed for post-injection pain. I did discuss with her that her hip arthritis is severe enough she would likely be a candidate for hip replacement, however she would like to hold off on this for now and see how she does with these about conservative measures.  I did provide in my note a brief overview of what the surgical THA and rehab would entail with her.  She will follow-up in 1 month for reevaluation.  Follow-up: Return in about 4 weeks (around 05/30/2022) for for right hip f/u .   Meds & Orders: No orders of the defined types were placed in this encounter.   Orders Placed This Encounter  Procedures   Large Joint Inj   US Guided Needle Placement -  No Linked Charges     Procedures: Large Joint Inj: R hip joint on 05/02/2022 3:04 PM Indications: pain Details: 22 G 3.5 in needle, ultrasound-guided anterior approach Medications: 4 mL lidocaine 1 %; 80 mg methylPREDNISolone acetate 40 MG/ML Outcome: tolerated well, no immediate complications  Procedure: US-guided intra-articular hip injection, right After discussion on risks/benefits/indications and informed verbal consent was obtained, a timeout was performed. Patient was lying supine on exam table. The hip was cleaned with betadine and alcohol swabs. Then utilizing  ultrasound guidance, the patient's femoral head and neck junction was identified and subsequently injected with 4:2 lidocaine:depomedrol via an in-plane approach with ultrasound visualization of the injectate administered into the hip joint. Patient tolerated procedure well without immediate complications.  Procedure, treatment alternatives, risks and benefits explained, specific risks discussed. Consent was given by the patient. Immediately prior to procedure a time out was called to verify the correct patient, procedure, equipment, support staff and site/side marked as required. Patient was prepped and draped in the usual sterile fashion.          Clinical History: No specialty comments available.  She reports that she has never smoked. She has never used smokeless tobacco. No results for input(s): "HGBA1C", "LABURIC" in the last 8760 hours.  Objective:   Vital Signs: There were no vitals taken for this visit.  Physical Exam  Gen: Well-appearing, in no acute distress; non-toxic CV: Regular Rate. Well-perfused. Warm.  Resp: Breathing unlabored on room air; no wheezing. Psych: Fluid speech in conversation; appropriate affect; normal thought process Neuro: Sensation intact throughout. No gross coordination deficits.   Ortho Exam - Right hip: There is no significant bony TTP, no greater trochanteric TTP.  No effusion or redness.  There is gross restriction and internal logroll as well as some restriction in external rotation of logroll.  Positive FADIR with marked bony mechanical block to internal rotation.  Strength 5/5 in all directions. + Stinchfield test.  Imaging:  Independent review of right 3 view hip x-ray including AP and lateral film on 04/27/2022 was independently reviewed by myself.  X-rays demonstrate severe advanced osteoarthritis of the right hip.  There is significant sclerosis within the acetabulum and femoral head.  There is a spur off the superior lateral aspect and  inferior medial aspect of the femoral head.  There is bony cystic formation in the acetabulum and superior humeral head.  No acute fracture noted.  DG Hip Unilat W OR W/O Pelvis 2-3 Views Right CLINICAL DATA:  Right hip pain.  Decreased range of motion.  EXAM: DG HIP (WITH OR WITHOUT PELVIS) 2-3V RIGHT  COMPARISON:  None Available.  FINDINGS: Diffuse severe joint space narrowing throughout the right hip. Evidence for subchondral cyst formations involving the femoral head and right acetabulum. Negative for fracture or dislocation. Visualized pelvic ring is intact.  IMPRESSION: Severe osteoarthritis in the right hip. No acute bone abnormality.  Electronically Signed   By: Richarda Overlie M.D.   On: 04/29/2022 08:56    Past Medical/Family/Surgical/Social History: Medications & Allergies reviewed per EMR, new medications updated. Patient Active Problem List   Diagnosis Date Noted   Aortic atherosclerosis (HCC) 06/21/2021   Essential hypertension 06/18/2019   Pure hypercholesterolemia 06/18/2019   Family history of heart disease 06/18/2019   Murmur 06/18/2019   Hyperlipidemia    Past Medical History:  Diagnosis Date   Allergy    Arthritis    Phreesia 03/17/2020   Hyperlipidemia    Hypertension    Family History  Problem Relation Age of Onset   Heart disease Mother    Hypertension Mother    Arthritis Mother    Heart disease Father    Past Surgical History:  Procedure Laterality Date   CESAREAN SECTION     Social History   Occupational History   Not on file  Tobacco Use   Smoking status: Never   Smokeless tobacco: Never  Substance and Sexual Activity   Alcohol use: No   Drug use: No   Sexual activity: Not on file

## 2022-05-02 NOTE — Progress Notes (Signed)
Right hip pain; gradually worsening over the last few weeks Pointing more so to the trochanter than actual hip joint  Here today hoping for injection

## 2022-05-04 ENCOUNTER — Other Ambulatory Visit: Payer: Self-pay | Admitting: Family Medicine

## 2022-05-04 ENCOUNTER — Telehealth: Payer: Self-pay

## 2022-05-04 MED ORDER — DICLOFENAC SODIUM 75 MG PO TBEC: 75.0000 mg | DELAYED_RELEASE_TABLET | Freq: Two times a day (BID) | ORAL | 4 refills | Status: DC

## 2022-05-04 NOTE — Telephone Encounter (Signed)
Pt had steroid injections in her back on Tuesday. Pt asks if she can have a refill on the Diclofenac, as she is going out of town next week? Thank you.

## 2022-05-30 ENCOUNTER — Ambulatory Visit: Payer: Medicare PPO | Admitting: Sports Medicine

## 2022-05-31 ENCOUNTER — Other Ambulatory Visit: Payer: Self-pay | Admitting: Family Medicine

## 2022-05-31 NOTE — Telephone Encounter (Signed)
Requested Prescriptions  Pending Prescriptions Disp Refills   simvastatin (ZOCOR) 40 MG tablet [Pharmacy Med Name: Simvastatin 40 MG Oral Tablet] 90 tablet 0    Sig: TAKE 1 TABLET BY MOUTH AT BEDTIME     Cardiovascular:  Antilipid - Statins Failed - 05/31/2022  6:51 AM      Failed - Lipid Panel in normal range within the last 12 months    Cholesterol  Date Value Ref Range Status  04/25/2022 155 <200 mg/dL Final   LDL Cholesterol (Calc)  Date Value Ref Range Status  04/25/2022 68 mg/dL (calc) Final    Comment:    Reference range: <100 . Desirable range <100 mg/dL for primary prevention;   <70 mg/dL for patients with CHD or diabetic patients  with > or = 2 CHD risk factors. Marland Kitchen LDL-C is now calculated using the Martin-Hopkins  calculation, which is a validated novel method providing  better accuracy than the Friedewald equation in the  estimation of LDL-C.  Cresenciano Genre et al. Annamaria Helling. 7371;062(69): 2061-2068  (http://education.[facility].com/faq/FAQ164)    HDL  Date Value Ref Range Status  04/25/2022 73 > OR = 50 mg/dL Final   Triglycerides  Date Value Ref Range Status  04/25/2022 62 <150 mg/dL Final         Passed - Patient is not pregnant      Passed - Valid encounter within last 12 months    Recent Outpatient Visits           8 months ago LLQ abdominal pain   [place] [facility], Cammie Mcgee, MD   1 year ago Benign essential HTN   [place] [facility], Cammie Mcgee, MD   2 years ago Routine general medical examination at a health care facility   [place], Cammie Mcgee, MD   3 years ago Routine general medical examination at a health care facility   [place], Cammie Mcgee, MD   4 years ago Benign essential HTN   [place], Cammie Mcgee, MD       Future Appointments             [place], [place], [place]

## 2022-06-01 ENCOUNTER — Encounter: Payer: Self-pay | Admitting: Sports Medicine

## 2022-06-01 ENCOUNTER — Ambulatory Visit: Payer: Medicare PPO | Admitting: Sports Medicine

## 2022-06-01 DIAGNOSIS — M25551 Pain in right hip: Secondary | ICD-10-CM

## 2022-06-01 DIAGNOSIS — M1611 Unilateral primary osteoarthritis, right hip: Secondary | ICD-10-CM | POA: Diagnosis not present

## 2022-06-01 NOTE — Progress Notes (Signed)
Doing good; feels like the injection did help   Noticed pain some with the weather change; but overall doing good

## 2022-06-01 NOTE — Progress Notes (Signed)
Amy Dennis - 72 y.o. female MRN [id number]  Date of birth: 30-Nov-1950  Office Visit Note: Visit Date: 06/01/2022 PCP: Susy Frizzle, MD Referred by: Susy Frizzle, MD  Subjective: Chief Complaint  Patient presents with   Right Hip - Follow-up   HPI: Amy Dennis is a pleasant 72 y.o. female who presents today for right hip pain with known severe hip osteoarthritis.  She does report getting good relief from the injection, although has good days and bad days.  She does notice increased pain with weather changes such as rain or cold weather.  Overall is still doing much improved with less pain since prior to the injection.  This was performed on 05/02/2022.  She has been talking to family members and friends.  Hide hip replacement however, I think she is interested in pursuing this given the degree of her arthritis.  She does continue on oral diclofenac 75 mg twice daily.  Would like to get back to playing pickleball.  Pertinent ROS were reviewed with the patient and found to be negative unless otherwise specified above in HPI.   Assessment & Plan: Visit Diagnoses:  1. Unilateral primary osteoarthritis, right hip   2. Pain in right hip    Plan: I had a good discussion with Amy Dennis her hip pain and significant osteoarthritis.  It is reassuring that she received good relief from her pain after the epidural injection, confirming that this is from her hip osteoarthritis.  She has done some discussion with family with prior hip replacement, and is interested in pursuing this option possibly later in the spring or early summer.  I did answer general overview of her surgical questions and expected recovery, but I would like her to follow-up with Dr. Ninfa Linden at her request to discuss the procedure in detail and potentially schedule when this would be a good time for her.  She may continue the diclofenac 75 mg twice daily, we did discuss taking this more on a as needed basis.  She  may continue her home hip stabilization exercises until that time.  We can always consider repeat injection to bridge the gap, but did discuss with her we would likely want about 3 months of an injection holiday prior to her scheduled THA.  Follow-up: Return for with Dr. Ninfa Linden to discuss right hip replacement.   Meds & Orders: No orders of the defined types were placed in this encounter.  No orders of the defined types were placed in this encounter.    Procedures: No procedures performed      Clinical History: No specialty comments available.  She reports that she has never smoked. She has never used smokeless tobacco. No results for input(s): "HGBA1C", "LABURIC" in the last 8760 hours.  Objective:    Physical Exam  Gen: Well-appearing, in no acute distress; non-toxic CV: Well-perfused. Warm.  Resp: Breathing unlabored on room air; no wheezing. Psych: Fluid speech in conversation; appropriate affect; normal thought process Neuro: Sensation intact throughout. No gross coordination deficits.   Ortho Exam - Right hip: There is no specific bony TTP.  There is gross restriction with internal logroll and some restriction in external rotation with logroll.  Positive FADIR with marked bony mechanical block to internal rotation and some pain.  Strength of the hip joint is 5/5 in all directions.  Positive Stinchfield test. NVI.  Imaging: DG Hip Unilat W OR W/O Pelvis 2-3 Views Right CLINICAL DATA:  Right hip pain.  Decreased range  of motion.  EXAM: DG HIP (WITH OR WITHOUT PELVIS) 2-3V RIGHT  COMPARISON:  None Available.  FINDINGS: Diffuse severe joint space narrowing throughout the right hip. Evidence for subchondral cyst formations involving the femoral head and right acetabulum. Negative for fracture or dislocation. Visualized pelvic ring is intact.  IMPRESSION: Severe osteoarthritis in the right hip. No acute bone abnormality.  Electronically Signed   By: Markus Daft M.D.    On: 04/29/2022 08:56    Past Medical/Family/Surgical/Social History: Medications & Allergies reviewed per EMR, new medications updated. Patient Active Problem List   Diagnosis Date Noted   Aortic atherosclerosis ([place]) 06/21/2021   Essential hypertension 06/18/2019   Pure hypercholesterolemia 06/18/2019   Family history of heart disease 06/18/2019   Murmur 06/18/2019   Hyperlipidemia    Past Medical History:  Diagnosis Date   Allergy    Arthritis    Phreesia 03/17/2020   Hyperlipidemia    Hypertension    Family History  Problem Relation Age of Onset   Heart disease Mother    Hypertension Mother    Arthritis Mother    Heart disease Father    Past Surgical History:  Procedure Laterality Date   CESAREAN SECTION     Social History   Occupational History   Not on file  Tobacco Use   Smoking status: Never   Smokeless tobacco: Never  Substance and Sexual Activity   Alcohol use: No   Drug use: No   Sexual activity: Not on file   I spent 35 minutes in the care of the patient today including face-to-face time, preparation to see the patient, as well as review of x-rays, counseling educating the patient on home exercise plan, aquatic therapy; discussion on general overview of what a right total hip arthroplasty would entail and asked questions regarding my surgical partners timeframe, recovery and expected therapy for the above diagnoses.   Elba Barman, DO Primary Care Sports Medicine Physician  [place]  This note was dictated using Dragon naturally speaking software and may contain errors in syntax, spelling, or content which have not been identified prior to signing this note.

## 2022-06-27 ENCOUNTER — Ambulatory Visit: Payer: Medicare PPO | Admitting: Orthopaedic Surgery

## 2022-06-27 ENCOUNTER — Encounter: Payer: Self-pay | Admitting: Orthopaedic Surgery

## 2022-06-27 DIAGNOSIS — M1611 Unilateral primary osteoarthritis, right hip: Secondary | ICD-10-CM

## 2022-06-27 NOTE — Progress Notes (Signed)
Office Visit Note   Patient: Amy Dennis           Date of Birth: [birth year]           MRN: [id number] Visit Date: 06/27/2022              Requested by: Susy Frizzle, MD [address] [place],  [place] [address] PCP: Susy Frizzle, MD   Assessment & Plan: Visit Diagnoses:  1. Unilateral primary osteoarthritis, right hip     Plan: Given patient's failure of treatment for right hip arthritis which is included medications, injections and exercise.  Recommend right total hip arthroplasty.  Patient would like to proceed with a right total hip arthroplasty in late April or early May.  Surgical procedure and postoperative protocol discussed.  Risk benefits of surgery discussed risk include but are not limited to nerve vessel injury, PE/DVT, wound healing problems, infection, leg length discrepancy and blood loss.  Handout on total hip arthroplasty was given.  Questions were encouraged and answered by Dr. Delilah Shan and myself.  Follow-Up Instructions: Return for POST OP.   Orders:  No orders of the defined types were placed in this encounter.  No orders of the defined types were placed in this encounter.     Procedures: No procedures performed   Clinical Data: No additional findings.   Subjective: Chief Complaint  Patient presents with   Right Hip - Pain    HPI 72 year old with right hip pain.  Patient's seen in the request of Dr. Rolena Infante for right hip osteoarthritis.  She has end-stage/severe osteoarthritis involving the right hip on radiographs which were reviewed personally by Dr. Delilah Shan and myself.  She states that she has tried diclofenac, exercises/stretching and had intra-articular injections without significant relief of her right hip pain.  She states that her hip pain becomes worse particularly with weather changes such as rain or cold fronts.  Pain in the hip inhibits her from doing such activities playing pickle ball.  Review of Systems  Constitutional:   Negative for chills and fever.  Respiratory:  Negative for shortness of breath.   Cardiovascular:  Negative for chest pain.     Objective: Vital Signs: There were no vitals taken for this visit.  Physical Exam Constitutional:      Appearance: She is normal weight. She is not ill-appearing or diaphoretic.  Pulmonary:     Effort: Pulmonary effort is normal.  Neurological:     Mental Status: She is alert and oriented to person, place, and time.  Psychiatric:        Mood and Affect: Mood normal.     Ortho Exam Left hip: Excellent range of motion without pain.  Right hip restricted internal/external rotation with discomfort.  Bilateral calves are supple nontender dorsiflexion plantarflexion bilateral ankles intact.  Specialty Comments:  No specialty comments available.  Imaging: No results found.   PMFS History: Patient Active Problem List   Diagnosis Date Noted   Aortic atherosclerosis ([place]) 06/21/2021   Essential hypertension 06/18/2019   Pure hypercholesterolemia 06/18/2019   Family history of heart disease 06/18/2019   Murmur 06/18/2019   Hyperlipidemia    Past Medical History:  Diagnosis Date   Allergy    Arthritis    Phreesia 03/17/2020   Hyperlipidemia    Hypertension     Family History  Problem Relation Age of Onset   Heart disease Mother    Hypertension Mother    Arthritis Mother  Heart disease Father     Past Surgical History:  Procedure Laterality Date   CESAREAN SECTION     Social History   Occupational History   Not on file  Tobacco Use   Smoking status: Never   Smokeless tobacco: Never  Substance and Sexual Activity   Alcohol use: No   Drug use: No   Sexual activity: Not on file

## 2022-07-07 ENCOUNTER — Telehealth: Payer: Self-pay

## 2022-07-07 NOTE — Telephone Encounter (Signed)
Pt called and has questions about continuing to take Diclofenac Pt is scheduled for hip surgery in April and wanted to make sure it is okay to continue to take this for 60 days. Pt states she has been trying to not take it unless absolutely needed and is using Extra Strength Tylenol instead. She wanted to make sure that is okay also? Thank you.

## 2022-07-14 ENCOUNTER — Other Ambulatory Visit: Payer: Self-pay
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
## 2022-07-24 ENCOUNTER — Other Ambulatory Visit: Payer: Self-pay | Admitting: Family Medicine

## 2022-07-24 ENCOUNTER — Ambulatory Visit (HOSPITAL_BASED_OUTPATIENT_CLINIC_OR_DEPARTMENT_OTHER): Payer: Medicare PPO | Admitting: Cardiology

## 2022-07-24 ENCOUNTER — Encounter (HOSPITAL_BASED_OUTPATIENT_CLINIC_OR_DEPARTMENT_OTHER): Payer: Self-pay | Admitting: Cardiology

## 2022-07-24 VITALS — BP 128/78 | HR 66 | Ht 64.0 in | Wt 153.0 lb

## 2022-07-24 DIAGNOSIS — I10 Essential (primary) hypertension: Secondary | ICD-10-CM | POA: Diagnosis not present

## 2022-07-24 DIAGNOSIS — E7800 Pure hypercholesterolemia, unspecified: Secondary | ICD-10-CM

## 2022-07-24 DIAGNOSIS — Z01810 Encounter for preprocedural cardiovascular examination: Secondary | ICD-10-CM | POA: Diagnosis not present

## 2022-07-24 DIAGNOSIS — I700 Atherosclerosis of aorta: Secondary | ICD-10-CM

## 2022-07-24 DIAGNOSIS — Z8249 Family history of ischemic heart disease and other diseases of the circulatory system: Secondary | ICD-10-CM

## 2022-07-24 NOTE — Patient Instructions (Signed)
Medication Instructions:  Your physician recommends that you continue on your current medications as directed. Please refer to the Current Medication list given to you today.  *If you need a refill on your cardiac medications before your next appointment, please call your pharmacy*  Lab Work: NONE  Testing/Procedures: NONE  Follow-Up: At [facility], you and your health needs are our priority.  As part of our continuing mission to provide you with exceptional heart care, we have created designated Provider Care Teams.  These Care Teams include your primary Cardiologist (physician) and Advanced Practice Providers (APPs -  Physician Assistants and Nurse Practitioners) who all work together to provide you with the care you need, when you need it.  We recommend signing up for the patient portal called "MyChart".  Sign up information is provided on this After Visit Summary.  MyChart is used to connect with patients for Virtual Visits (Telemedicine).  Patients are able to view lab/test results, encounter notes, upcoming appointments, etc.  Non-urgent messages can be sent to your provider as well.   To learn more about what you can do with MyChart, go to [email or web address].    Your next appointment:   12 month(s)  The format for your next appointment:   In Person  Provider:   Bridgette Christopher, MD     

## 2022-07-25 ENCOUNTER — Ambulatory Visit: Payer: Self-pay

## 2022-07-25 NOTE — Telephone Encounter (Signed)
  Chief Complaint: covid exposure (husband) Symptoms: none Frequency: lives with spouse Pertinent Negatives: Patient denies any sx Disposition: [] ED /[] Urgent Care (no appt availability in office) / [] Appointment(In office/virtual)/ []  [place] [facility]/ [x] Home Care/ [] Refused Recommended Disposition /[] [place] [facility]/ []  Follow-up with PCP Additional Notes:  Reason for Disposition  [1] COVID-19 EXPOSURE within last 14 days AND [2] NO symptoms  Answer Assessment - Initial Assessment Questions 1. COVID-19 EXPOSURE: "Please describe how you were exposed to someone with a COVID-19 infection."     husband 2. PLACE of CONTACT: "Where were you when you were exposed to COVID-19?" (e.g., home, school, medical waiting room; which city?)     home 3. TYPE of CONTACT: "How much contact was there?" (e.g., sitting next to, live in same house, work in same office, same building)     Lives in same house 4. DURATION of CONTACT: "How long were you in contact with the COVID-19 patient?" (e.g., a few seconds, passed by person, a few minutes, 15 minutes or longer, live with the patient)     *No Answer* 5. MASK: "Were you wearing a mask?" "Was the other person wearing a mask?" Note: wearing a mask reduces the risk of an otherwise close contact.     *No Answer* 6. DATE of CONTACT: "When did you have contact with a COVID-19 patient?" (e.g., how many days ago)     *No Answer* 7. COMMUNITY SPREAD: "Do you live in or have you traveled to an area where there are lots of COVID-19 cases (community spread)?" (See public health department website, if unsure)       no 8. SYMPTOMS: "Do you have any symptoms?" (e.g., fever, cough, breathing difficulty, loss of taste or smell)     no 9. VACCINE: "Have you gotten the COVID-19 vaccine?" If Yes, ask: "Which one, how many shots, when did you get it?"     *No Answer* 10. PREGNANCY OR POSTPARTUM: "Is there any chance you are pregnant?" "When was your last  menstrual period?" "Did you deliver in the last 2 weeks?"       *No Answer* 11. HIGH RISK: "Do you have any heart or lung problems?" (e.g., asthma, COPD, heart failure) "Do you have a weak immune system or other risk factors?" (e.g., HIV positive, chemotherapy, renal failure, diabetes mellitus, sickle cell anemia, obesity)       *No Answer*  Protocols used: Coronavirus (COVID-19) Exposure-A-AH

## 2022-08-16 ENCOUNTER — Other Ambulatory Visit: Payer: Self-pay | Admitting: Family Medicine

## 2022-08-17 NOTE — Telephone Encounter (Signed)
Requested Prescriptions  Pending Prescriptions Disp Refills   diclofenac (VOLTAREN) 75 MG EC tablet [Pharmacy Med Name: Diclofenac Sodium 75 MG Oral Tablet Delayed Release] 180 tablet 0    Sig: Take 1 tablet by mouth twice daily     Analgesics:  NSAIDS Failed - 08/16/2022  4:54 PM      Failed - Manual Review: Labs are only required if the patient has taken medication for more than 8 weeks.      Passed - Cr in normal range and within 360 days    Creat  Date Value Ref Range Status  04/25/2022 0.65 0.60 - 1.00 mg/dL Final         Passed - HGB in normal range and within 360 days    Hemoglobin  Date Value Ref Range Status  04/25/2022 12.1 11.7 - 15.5 g/dL Final         Passed - PLT in normal range and within 360 days    Platelets  Date Value Ref Range Status  04/25/2022 177 140 - 400 Thousand/uL Final         Passed - HCT in normal range and within 360 days    HCT  Date Value Ref Range Status  04/25/2022 36.0 35.0 - 45.0 % Final         Passed - eGFR is 30 or above and within 360 days    GFR, Est African American  Date Value Ref Range Status  03/16/2020 89 > OR = 60 mL/min/1.67m2 Final   GFR, Est Non African American  Date Value Ref Range Status  03/16/2020 77 > OR = 60 mL/min/1.68m2 Final   eGFR  Date Value Ref Range Status  04/25/2022 94 > OR = 60 mL/min/1.82m2 Final         Passed - Patient is not pregnant      Passed - Valid encounter within last 12 months    Recent Outpatient Visits           10 months ago LLQ abdominal pain   [place] [name], Cammie Mcgee, MD   1 year ago Benign essential HTN   [place] [facility], Cammie Mcgee, MD   2 years ago Routine general medical examination at a health care facility   [place], Cammie Mcgee, MD   3 years ago Routine general medical examination at a health care facility   [place], Cammie Mcgee, MD   4 years ago Benign essential HTN   [place] [name], Cammie Mcgee, MD

## 2022-08-23 ENCOUNTER — Other Ambulatory Visit: Payer: Self-pay | Admitting: Physician Assistant

## 2022-08-23 DIAGNOSIS — Z01818 Encounter for other preprocedural examination: Secondary | ICD-10-CM

## 2022-08-26 ENCOUNTER — Other Ambulatory Visit: Payer: Self-pay | Admitting: Family Medicine

## 2022-08-28 NOTE — Telephone Encounter (Signed)
Requested Prescriptions  Pending Prescriptions Disp Refills   simvastatin (ZOCOR) 40 MG tablet [Pharmacy Med Name: Simvastatin 40 MG Oral Tablet] 90 tablet 0    Sig: TAKE 1 TABLET BY MOUTH AT BEDTIME     Cardiovascular:  Antilipid - Statins Failed - 08/26/2022  6:52 AM      Failed - Lipid Panel in normal range within the last 12 months    Cholesterol  Date Value Ref Range Status  04/25/2022 155 <200 mg/dL Final   LDL Cholesterol (Calc)  Date Value Ref Range Status  04/25/2022 68 mg/dL (calc) Final    Comment:    Reference range: <100 . Desirable range <100 mg/dL for primary prevention;   <70 mg/dL for patients with CHD or diabetic patients  with > or = 2 CHD risk factors. Marland Kitchen LDL-C is now calculated using the Martin-Hopkins  calculation, which is a validated novel method providing  better accuracy than the Friedewald equation in the  estimation of LDL-C.  Horald Pollen et al. Lenox Ahr. 7209;470(96): 2061-2068  (http://education.Quest[employer].com/faq/FAQ164)    HDL  Date Value Ref Range Status  04/25/2022 73 > OR = 50 mg/dL Final   Triglycerides  Date Value Ref Range Status  04/25/2022 62 <150 mg/dL Final         Passed - Patient is not pregnant      Passed - Valid encounter within last 12 months    Recent Outpatient Visits           11 months ago LLQ abdominal pain   [facility] [name], [name], MD   1 year ago Benign essential HTN   [facility] T[facility] Nones, [name], MD   2 years ago Routine general medical examination at a health care facility   [facility] [name], [name], MD   3 years ago Routine general medical examination at a health care facility   [facility] [name], [name], MD   4 years ago Benign essential HTN   [facility] [name], [name], MD

## 2022-08-29 NOTE — Progress Notes (Signed)
Surgical Instructions    Your procedure is scheduled on Tuesday September 05, 2022.  Report to [facility] Main Entrance "A" at 7:00 A.M., then check in with the Admitting office.  Call this number if you have problems the morning of surgery:  [phone]   If you have any questions prior to your surgery date call [phone]: Open Monday-Friday 8am-4pm If you experience any cold or flu symptoms such as cough, fever, chills, shortness of breath, etc. between now and your scheduled surgery, please notify us at the above number     Remember:  Do not eat after midnight the night before your surgery  You may drink clear liquids until 6:00 the morning of your surgery.   Clear liquids allowed are: Water, Non-Citrus Juices (without pulp), Carbonated Beverages, Clear Tea, Black Coffee ONLY (NO MILK, CREAM OR POWDERED CREAMER of any kind), and Gatorade    Take these medicines the morning of surgery with A SIP OF WATER:  fluticasone (FLONASE)  loratadine (CLARITIN)   If needed:  carboxymethylcellulose (REFRESH PLUS)  As of today, STOP taking any Aspirin (unless otherwise instructed by your surgeon) Aleve, Naproxen, Ibuprofen, Motrin, Advil, Goody's, BC's, all herbal medications, fish oil, and all vitamins.  Special instructions:    Oral Hygiene is also important to reduce your risk of infection.  Remember - BRUSH YOUR TEETH THE MORNING OF SURGERY WITH YOUR REGULAR TOOTHPASTE  Do not wear jewelry or makeup. Do not wear lotions, powders, perfumes/cologne or deodorant. Do not shave 48 hours prior to surgery.  Men may shave face and neck. Do not bring valuables to the hospital. Do not wear nail polish, gel polish, artificial nails, or any other type of covering on natural nails (fingers and toes) If you have artificial nails or gel coating that need to be removed by a nail salon, please have this removed prior to surgery. Artificial nails or gel coating may interfere with anesthesia's ability to  adequately monitor your vital signs.  [facility] is not responsible for any belongings or valuables.    Do NOT Smoke (Tobacco/Vaping)  24 hours prior to your procedure  If you use a CPAP at night, you may bring your mask for your overnight stay.   Contacts, glasses, hearing aids, dentures or partials may not be worn into surgery, please bring cases for these belongings   For patients admitted to the hospital, discharge time will be determined by your treatment team.   Patients discharged the day of surgery will not be allowed to drive home, and someone needs to stay with them for 24 hours.   SURGICAL WAITING ROOM VISITATION Patients having surgery or a procedure may have no more than 2 support people in the waiting area - these visitors may rotate.   Children under the age of 27 must have an adult with them who is not the patient. If the patient needs to stay at the hospital during part of their recovery, the visitor guidelines for inpatient rooms apply. Pre-op nurse will coordinate an appropriate time for 1 support person to accompany patient in pre-op.  This support person may not rotate.   Please refer to [email or web address] for the visitor guidelines for Inpatients (after your surgery is over and you are in a regular room).    If you received a COVID test during your pre-op visit, it is requested that you wear a mask when out in public, stay away from anyone that may not be feeling well, and notify your surgeon if  you develop symptoms. If you have been in contact with anyone that has tested positive in the last 10 days, please notify your surgeon.    Please read over the following fact sheets that you were given.

## 2022-08-30 ENCOUNTER — Encounter (HOSPITAL_COMMUNITY): Payer: Self-pay

## 2022-08-30 ENCOUNTER — Encounter (HOSPITAL_COMMUNITY)
Admission: RE | Admit: 2022-08-30 | Discharge: 2022-08-30 | Disposition: A | Discharge status: Home / Self Care | Payer: Medicare PPO | Source: Ambulatory Visit | Attending: Orthopaedic Surgery | Admitting: Orthopaedic Surgery

## 2022-08-30 DIAGNOSIS — I700 Atherosclerosis of aorta: Secondary | ICD-10-CM | POA: Insufficient documentation

## 2022-08-30 DIAGNOSIS — R011 Cardiac murmur, unspecified: Secondary | ICD-10-CM | POA: Diagnosis not present

## 2022-08-30 DIAGNOSIS — E785 Hyperlipidemia, unspecified: Secondary | ICD-10-CM | POA: Diagnosis not present

## 2022-08-30 DIAGNOSIS — E7800 Pure hypercholesterolemia, unspecified: Secondary | ICD-10-CM | POA: Diagnosis not present

## 2022-08-30 DIAGNOSIS — I119 Hypertensive heart disease without heart failure: Secondary | ICD-10-CM | POA: Insufficient documentation

## 2022-08-30 DIAGNOSIS — Z01818 Encounter for other preprocedural examination: Secondary | ICD-10-CM | POA: Diagnosis present

## 2022-08-30 HISTORY — DX: Cardiac murmur, unspecified: R01.1

## 2022-08-30 LAB — TYPE AND SCREEN
ABO/RH(D): A NEG
Antibody Screen: NEGATIVE

## 2022-08-30 LAB — COMPREHENSIVE METABOLIC PANEL
ALT: 18 U/L (ref 0–44)
AST: 21 U/L (ref 15–41)
Albumin: 3.8 g/dL (ref 3.5–5.0)
Alkaline Phosphatase: 63 U/L (ref 38–126)
Anion gap: 10 (ref 5–15)
BUN: 16 mg/dL (ref 8–23)
CO2: 27 mmol/L (ref 22–32)
Calcium: 8.9 mg/dL (ref 8.9–10.3)
Chloride: 101 mmol/L (ref 98–111)
Creatinine, Ser: 0.85 mg/dL (ref 0.44–1.00)
GFR, Estimated: >60 mL/min (ref >60)
Glucose, Bld: 104 mg/dL — ABNORMAL HIGH (ref 70–99)
Potassium: 3.5 mmol/L (ref 3.5–5.1)
Sodium: 138 mmol/L (ref 135–145)
Total Bilirubin: 0.5 mg/dL (ref 0.3–1.2)
Total Protein: 7.1 g/dL (ref 6.5–8.1)

## 2022-08-30 LAB — CBC
HCT: 35.3 % — ABNORMAL LOW (ref 36.0–46.0)
Hemoglobin: 11.7 g/dL — ABNORMAL LOW (ref 12.0–15.0)
MCH: 29.1 pg (ref 26.0–34.0)
MCHC: 33.1 g/dL (ref 30.0–36.0)
MCV: 87.8 fL (ref 80.0–100.0)
Platelets: 195 10*3/uL (ref 150–400)
RBC: 4.02 MIL/uL (ref 3.87–5.11)
RDW: 13 % (ref 11.5–15.5)
WBC: 6 10*3/uL (ref 4.0–10.5)
nRBC: 0 % (ref 0.0–0.2)

## 2022-08-30 LAB — SURGICAL PCR SCREEN
MRSA, PCR: NEGATIVE
Staphylococcus aureus: POSITIVE — AB

## 2022-08-30 NOTE — Progress Notes (Addendum)
PCP - [place] with [facility]  Cardiologist - Dr. Jodelle Red  PPM/ICD - Denies  Chest x-ray - N/I EKG - 07/24/22 Stress Test - yes by Dr. Clarene Duke around age 72 YO ECHO - 07/08/19 Cardiac Cath - Not sure had dye injected with a scan - 16 years ago  Sleep Study - Denies  DM - Denies  Blood Thinner Instructions: N/A Aspirin Instructions: N/A  ERAS Protcol -Yes PRE-SURGERY Ensure Yes   COVID TEST- N/I Husband had covid March 12th.  Patient did not contract, she did test herself to be sure. Husband mild symptoms.  Patient denies any respiratory virus / illness in the last two months.   Anesthesia review: Yes see cardiology has preop clearance   Patient denies shortness of breath, fever, cough and chest pain at PAT appointment   All instructions explained to the patient, with a verbal understanding of the material. Patient agrees to go over the instructions while at home for a better understanding.  The opportunity to ask questions was provided.

## 2022-08-31 NOTE — Progress Notes (Signed)
Anesthesia Chart Review:  Case: [id number] Date/Time: 09/05/22 0852   Procedure: RIGHT TOTAL HIP ARTHROPLASTY ANTERIOR APPROACH (Right: Hip)   Anesthesia type: Choice   Pre-op diagnosis: endstage osteoarthritis right hip   Location: [facility] ROOM 08 / [facility]   Surgeons: Kathryne Hitch, MD       DISCUSSION: Patient is a 72 year old female scheduled for the above procedure.  History includes never smoker, HTN, HLD, murmur (echo 06/18/19: "There is accelerated blood flow (Doppler signal) through outflow tract  secondary to asymmetric septal hypertrophy. This is source of murmur. No  aortic stenosis."). Coronary calcium score of 0 on 07/16/20.   She had cardiology evaluation by Dr. Cristal Deer on 07/24/22. Followed for HTN, hypercholesterolemia, murmur, and family history of CAD. She had aortic sclerosis without AS and had accelerated blood flow through outflow trace secondary to asymmetric septal hypertrophy by 06/18/19 echo and felt to be the source of her murmur. She had a Coronary Calcium score of 0 on 07/16/20. Dr. Cristal Deer wrote, "Preoperative cardiovascular visit According to the Revised Cardiac Risk Index (RCRI), her Perioperative Risk of Major Cardiac Event is (%): 0.4   Her Functional Capacity in METs is: 7.59 according to the Duke Activity Status Index (DASI).   The patient is not currently having active cardiac symptoms, and they can achieve >4 METs of activity.   According to ACC/AHA Guidelines, no further testing is needed.  Proceed with surgery at acceptable risk.  Our service is available as needed in the peri-operative period"  Anesthesia team to evaluated on the day of surgery.   VS: BP (!) 145/92   Pulse 70   Temp 36.7 C   Resp 16   Ht  (1.626 m)   Wt 69.4 kg   SpO2 100%   BMI 26.26 kg/m   PROVIDERS: Donita Brooks, MD is PCP  Jodelle Red, MD is cardiologist   LABS: Labs reviewed: Acceptable for surgery. (all labs ordered are listed, but  only abnormal results are displayed)  Labs Reviewed  SURGICAL PCR SCREEN - Abnormal; Notable for the following components:      Result Value   Staphylococcus aureus POSITIVE (*)    All other components within normal limits  CBC - Abnormal; Notable for the following components:   Hemoglobin 11.7 (*)    HCT 35.3 (*)    All other components within normal limits  COMPREHENSIVE METABOLIC PANEL - Abnormal; Notable for the following components:   Glucose, Bld 104 (*)    All other components within normal limits  TYPE AND SCREEN     IMAGES: Right Hip Xray 04/27/22: IMPRESSION: Severe osteoarthritis in the right hip. No acute bone abnormality.   EKG: 07/24/2022: Normal sinus rhythm.  Possible left atrial enlargement.   CV: CT Cardiac Calcium Scoring 07/16/20: IMPRESSION: Coronary calcium score of 0. This is a low risk study. Aortic atherosclerosis and annular calcification is noted.   Echo 06/18/19: IMPRESSIONS   1. Left ventricular ejection fraction, by visual estimation, is 65 to  70%. The left ventricle has hyperdynamic function. There is moderately  increased left ventricular hypertrophy.   2. Left ventricular diastolic parameters are consistent with Grade I  diastolic dysfunction (impaired relaxation).   3. The left ventricle has no regional wall motion abnormalities.   4. Global right ventricle has normal systolic function.The right  ventricular size is normal. No increase in right ventricular wall  thickness.   5. Left atrial size was normal.   6. Right atrial size  was normal.   7. The mitral valve is normal in structure. No evidence of mitral valve  regurgitation. No evidence of mitral stenosis.   8. The tricuspid valve is normal in structure.   9. The tricuspid valve is normal in structure. Tricuspid valve  regurgitation is not demonstrated.  10. The aortic valve is normal in structure. Aortic valve regurgitation is  not visualized. Mild aortic valve sclerosis without  stenosis.  11. There is accelerated blood flow (Doppler signal) through outflow tract  secondary to asymmetric septal hypertrophy. This is source of murmur. No  aortic stenosis.  12. The pulmonic valve was normal in structure. Pulmonic valve  regurgitation is not visualized.  13. The inferior vena cava is normal in size with greater than 50%  respiratory variability, suggesting right atrial pressure of 3 mmHg.    She reported a stress test over 15 years ago.   Past Medical History:  Diagnosis Date   Allergy    Arthritis    Phreesia 03/17/2020   Heart murmur    Hyperlipidemia    Hypertension     Past Surgical History:  Procedure Laterality Date   CESAREAN SECTION     x 2   WISDOM TOOTH EXTRACTION Bilateral     MEDICATIONS:  Melatonin 10 MG TABS   CALCIUM-VITAMIN D PO   carboxymethylcellulose (REFRESH PLUS) 0.5 % SOLN   cholecalciferol 25 MCG (1000 UT) tablet   diclofenac (VOLTAREN) 75 MG EC tablet   fluticasone (FLONASE) 50 MCG/ACT nasal spray   hydrochlorothiazide (HYDRODIURIL) 25 MG tablet   Inulin (FIBER CHOICE PREBIOTIC FIBER) 1.5 g CHEW   lidocaine (SALONPAS PAIN RELIEVING) 4 %   loratadine (CLARITIN) 10 MG tablet   Menthol, Topical Analgesic, (BIOFREEZE EX)   simvastatin (ZOCOR) 40 MG tablet   No current facility-administered medications for this encounter.    Shonna Chock, PA-C Surgical Short Stay/Anesthesiology [facility] Phone [phone] [facility] Phone [phone] 08/31/2022 11:51 AM

## 2022-08-31 NOTE — Anesthesia Preprocedure Evaluation (Addendum)
Anesthesia Evaluation  Patient identified by MRN, date of birth, ID band Patient awake    Reviewed: Allergy & Precautions, NPO status , Patient's Chart, lab work & pertinent test results  History of Anesthesia Complications Negative for: history of anesthetic complications  Airway Mallampati: II  TM Distance: >3 FB Neck ROM: Full    Dental  (+) Dental Advisory Given   Pulmonary neg pulmonary ROS   breath sounds clear to auscultation       Cardiovascular hypertension, Pt. on medications (-) angina  Rhythm:Regular Rate:Normal  '21 ECHO: EF 65-70%.  1. The LV has hyperdynamic function. There is moderately  increased LVH.   2. Left ventricular diastolic parameters are consistent with Grade I diastolic dysfunction (impaired relaxation).   3. The left ventricle has no regional wall motion abnormalities.   4. Global right ventricle has normal systolic function  5. No significant valvular abnormalities    Neuro/Psych negative neurological ROS     GI/Hepatic negative GI ROS, Neg liver ROS,,,  Endo/Other  negative endocrine ROS    Renal/GU negative Renal ROS     Musculoskeletal   Abdominal   Peds  Hematology negative hematology ROS (+) Plt 195k   Anesthesia Other Findings   Reproductive/Obstetrics                             Anesthesia Physical Anesthesia Plan  ASA: 2  Anesthesia Plan: Spinal   Post-op Pain Management: Tylenol PO (pre-op)*   Induction:   PONV Risk Score and Plan: 2  Airway Management Planned: Natural Airway and Simple Face Mask  Additional Equipment: None  Intra-op Plan:   Post-operative Plan:   Informed Consent: I have reviewed the patients History and Physical, chart, labs and discussed the procedure including the risks, benefits and alternatives for the proposed anesthesia with the patient or authorized representative who has indicated his/her understanding and  acceptance.     Dental advisory given  Plan Discussed with: CRNA and Surgeon  Anesthesia Plan Comments: (PAT note written 08/31/2022 by Shonna Chock, PA-C.  )       Anesthesia Quick Evaluation

## 2022-09-04 ENCOUNTER — Telehealth: Payer: Self-pay | Admitting: *Deleted

## 2022-09-04 DIAGNOSIS — M1611 Unilateral primary osteoarthritis, right hip: Secondary | ICD-10-CM | POA: Insufficient documentation

## 2022-09-04 NOTE — Telephone Encounter (Signed)
Ortho bundle pre-op call completed. 

## 2022-09-04 NOTE — Care Plan (Signed)
[facility] RNCM call to patient to discuss her upcoming Right total hip arthroplasty with Dr. [name] on 09/05/22. She is an Ortho bundle through [facility] and is agreeable to case management. She lives with her spouse, who will be assisting after surgery at discharge. She has a rollator already and a 3in1/BSC. Anticipate HHPT will be needed after a short hospital stay. Referral made to [facility] after choice provided. Reviewed all post op care instructions. Will continue to follow for needs.

## 2022-09-04 NOTE — H&P (Signed)
TOTAL HIP ADMISSION H&P  Patient is admitted for right total hip arthroplasty.  Subjective:  Chief Complaint: right hip pain  HPI: Amy Dennis, 72 y.o. female, has a history of pain and functional disability in the right hip(s) due to arthritis and patient has failed non-surgical conservative treatments for greater than 12 weeks to include NSAID's and/or analgesics, corticosteriod injections, flexibility and strengthening excercises, and activity modification.  Onset of symptoms was gradual starting 3 years ago with gradually worsening course since that time.The patient noted no past surgery on the right hip(s).  Patient currently rates pain in the right hip at 10 out of 10 with activity. Patient has night pain, worsening of pain with activity and weight bearing, pain that interfers with activities of daily living, and pain with passive range of motion. Patient has evidence of subchondral cysts, subchondral sclerosis, periarticular osteophytes, and joint space narrowing by imaging studies. This condition presents safety issues increasing the risk of falls.  There is no current active infection.  Patient Active Problem List   Diagnosis Date Noted   Unilateral primary osteoarthritis, right hip 09/04/2022   Aortic atherosclerosis 06/21/2021   Essential hypertension 06/18/2019   Pure hypercholesterolemia 06/18/2019   Family history of heart disease 06/18/2019   Murmur 06/18/2019   Hyperlipidemia    Past Medical History:  Diagnosis Date   Allergy    Arthritis    Phreesia 03/17/2020   Heart murmur    Hyperlipidemia    Hypertension     Past Surgical History:  Procedure Laterality Date   CESAREAN SECTION     x 2   WISDOM TOOTH EXTRACTION Bilateral     No current facility-administered medications for this encounter.   Current Outpatient Medications  Medication Sig Dispense Refill Last Dose   CALCIUM-VITAMIN D PO Take 1 tablet by mouth in the morning.      carboxymethylcellulose  (REFRESH PLUS) 0.5 % SOLN Place 1 drop into both eyes daily as needed (dry/irritated eyes.).      cholecalciferol 25 MCG (1000 UT) tablet Take 1,000 Units by mouth in the morning.      diclofenac (VOLTAREN) 75 MG EC tablet Take 1 tablet by mouth twice daily 180 tablet 0    fluticasone (FLONASE) 50 MCG/ACT nasal spray Use 2 spray(s) in each nostril once daily 48 g 3    hydrochlorothiazide (HYDRODIURIL) 25 MG tablet Take 1 tablet by mouth once daily 90 tablet 0    Inulin (FIBER CHOICE PREBIOTIC FIBER) 1.5 g CHEW Chew 1.5 g by mouth in the morning.      lidocaine (SALONPAS PAIN RELIEVING) 4 % Place 1 patch onto the skin daily as needed (PAIN.).      loratadine (CLARITIN) 10 MG tablet Take 10 mg by mouth daily.      Menthol, Topical Analgesic, (BIOFREEZE EX) Apply 1 Application topically 3 (three) times daily as needed (pain.).      Melatonin 10 MG TABS Take 10 mg by mouth at bedtime as needed (for sleep).      simvastatin (ZOCOR) 40 MG tablet TAKE 1 TABLET BY MOUTH AT BEDTIME 90 tablet 0    Allergies  Allergen Reactions   Mobic [Meloxicam] Diarrhea    Social History   Tobacco Use   Smoking status: Never   Smokeless tobacco: Never  Substance Use Topics   Alcohol use: No    Family History  Problem Relation Age of Onset   Heart disease Mother    Hypertension Mother    Arthritis  Mother    Heart disease Father      Review of Systems  Objective:  Physical Exam Vitals reviewed.  Constitutional:      Appearance: Normal appearance. She is normal weight.  HENT:     Head: Normocephalic and atraumatic.  Eyes:     Extraocular Movements: Extraocular movements intact.     Pupils: Pupils are equal, round, and reactive to light.  Cardiovascular:     Rate and Rhythm: Normal rate and regular rhythm.     Pulses: Normal pulses.  Pulmonary:     Effort: Pulmonary effort is normal.     Breath sounds: Normal breath sounds.  Abdominal:     Palpations: Abdomen is soft.  Musculoskeletal:      Cervical back: Normal range of motion and neck supple.     Right hip: Tenderness and bony tenderness present. Decreased range of motion. Decreased strength.  Neurological:     Mental Status: She is alert and oriented to person, place, and time.  Psychiatric:        Behavior: Behavior normal.     Vital signs in last 24 hours:    Labs:   Estimated body mass index is 26.26 kg/m as calculated from the following:   Height as of 08/30/22:  (1.626 m).   Weight as of 08/30/22: 69.4 kg.   Imaging Review Plain radiographs demonstrate severe degenerative joint disease of the right hip(s). The bone quality appears to be excellent for age and reported activity level.      Assessment/Plan:  End stage arthritis, right hip(s)  The patient history, physical examination, clinical judgement of the provider and imaging studies are consistent with end stage degenerative joint disease of the right hip(s) and total hip arthroplasty is deemed medically necessary. The treatment options including medical management, injection therapy, arthroscopy and arthroplasty were discussed at length. The risks and benefits of total hip arthroplasty were presented and reviewed. The risks due to aseptic loosening, infection, stiffness, dislocation/subluxation,  thromboembolic complications and other imponderables were discussed.  The patient acknowledged the explanation, agreed to proceed with the plan and consent was signed. Patient is being admitted for inpatient treatment for surgery, pain control, PT, OT, prophylactic antibiotics, VTE prophylaxis, progressive ambulation and ADL's and discharge planning.The patient is planning to be discharged home with home health services

## 2022-09-05 ENCOUNTER — Ambulatory Visit (HOSPITAL_BASED_OUTPATIENT_CLINIC_OR_DEPARTMENT_OTHER): Payer: Medicare PPO | Admitting: Anesthesiology

## 2022-09-05 ENCOUNTER — Other Ambulatory Visit: Payer: Self-pay

## 2022-09-05 ENCOUNTER — Ambulatory Visit (HOSPITAL_COMMUNITY): Payer: Medicare PPO

## 2022-09-05 ENCOUNTER — Ambulatory Visit (HOSPITAL_COMMUNITY): Payer: Medicare PPO | Admitting: Vascular Surgery

## 2022-09-05 ENCOUNTER — Observation Stay (HOSPITAL_COMMUNITY): Payer: Medicare PPO

## 2022-09-05 ENCOUNTER — Encounter (HOSPITAL_COMMUNITY)
Admission: RE | Disposition: A | Discharge status: Home Health | Payer: Self-pay | Source: Home / Self Care | Attending: Orthopaedic Surgery

## 2022-09-05 ENCOUNTER — Observation Stay (HOSPITAL_COMMUNITY)
Admission: RE | Admit: 2022-09-05 | Discharge: 2022-09-06 | Disposition: A | Discharge status: Home Health | Payer: Medicare PPO | Attending: Orthopaedic Surgery | Admitting: Orthopaedic Surgery

## 2022-09-05 ENCOUNTER — Encounter (HOSPITAL_COMMUNITY): Payer: Self-pay | Admitting: Orthopaedic Surgery

## 2022-09-05 DIAGNOSIS — I119 Hypertensive heart disease without heart failure: Secondary | ICD-10-CM | POA: Diagnosis not present

## 2022-09-05 DIAGNOSIS — I10 Essential (primary) hypertension: Secondary | ICD-10-CM | POA: Insufficient documentation

## 2022-09-05 DIAGNOSIS — M1611 Unilateral primary osteoarthritis, right hip: Secondary | ICD-10-CM

## 2022-09-05 DIAGNOSIS — Z794 Long term (current) use of insulin: Secondary | ICD-10-CM | POA: Diagnosis not present

## 2022-09-05 DIAGNOSIS — Z79899 Other long term (current) drug therapy: Secondary | ICD-10-CM | POA: Diagnosis not present

## 2022-09-05 DIAGNOSIS — Z96641 Presence of right artificial hip joint: Secondary | ICD-10-CM

## 2022-09-05 HISTORY — PX: TOTAL HIP ARTHROPLASTY: SHX124

## 2022-09-05 LAB — ABO/RH: ABO/RH(D): A NEG

## 2022-09-05 SURGERY — ARTHROPLASTY, HIP, TOTAL, ANTERIOR APPROACH
Anesthesia: Spinal | Site: Hip | Laterality: Right

## 2022-09-05 MED ORDER — CEFAZOLIN SODIUM-DEXTROSE 1-4 GM/50ML-% IV SOLN
1.0000 g | Freq: Four times a day (QID) | INTRAVENOUS | Status: AC
  Administered 2022-09-05 (×2): 1 g via INTRAVENOUS
  Filled 2022-09-05 (×2): qty 50

## 2022-09-05 MED ORDER — ACETAMINOPHEN 500 MG PO TABS
1000.0000 mg | ORAL_TABLET | Freq: Once | ORAL | Status: AC
  Administered 2022-09-05: 1000 mg via ORAL
  Filled 2022-09-05: qty 2

## 2022-09-05 MED ORDER — HYDROMORPHONE HCL 1 MG/ML IJ SOLN
INTRAMUSCULAR | Status: AC
  Filled 2022-09-05: qty 1

## 2022-09-05 MED ORDER — ASPIRIN 81 MG PO CHEW
81.0000 mg | CHEWABLE_TABLET | Freq: Two times a day (BID) | ORAL | Status: DC
  Administered 2022-09-05 – 2022-09-06 (×2): 81 mg via ORAL
  Filled 2022-09-05 (×2): qty 1

## 2022-09-05 MED ORDER — MENTHOL 3 MG MT LOZG: 1.0000 | LOZENGE | OROMUCOSAL | Status: DC | PRN

## 2022-09-05 MED ORDER — HYDROMORPHONE HCL 1 MG/ML IJ SOLN: 0.5000 mg | INTRAMUSCULAR | Status: DC | PRN

## 2022-09-05 MED ORDER — PHENYLEPHRINE HCL-NACL 20-0.9 MG/250ML-% IV SOLN
INTRAVENOUS | Status: DC | PRN
  Administered 2022-09-05: 50 ug/min via INTRAVENOUS

## 2022-09-05 MED ORDER — LACTATED RINGERS IV SOLN: INTRAVENOUS | Status: DC

## 2022-09-05 MED ORDER — VITAMIN D 25 MCG (1000 UNIT) PO TABS
1000.0000 [IU] | ORAL_TABLET | Freq: Every morning | ORAL | Status: DC
  Administered 2022-09-06: 1000 [IU] via ORAL
  Filled 2022-09-05: qty 1

## 2022-09-05 MED ORDER — CEFAZOLIN SODIUM-DEXTROSE 2-4 GM/100ML-% IV SOLN
2.0000 g | INTRAVENOUS | Status: AC
  Administered 2022-09-05: 2 g via INTRAVENOUS
  Filled 2022-09-05: qty 100

## 2022-09-05 MED ORDER — OXYCODONE HCL 5 MG PO TABS
5.0000 mg | ORAL_TABLET | ORAL | Status: DC | PRN
  Administered 2022-09-05 – 2022-09-06 (×5): 10 mg via ORAL
  Filled 2022-09-05 (×5): qty 2

## 2022-09-05 MED ORDER — ORAL CARE MOUTH RINSE: 15.0000 mL | Freq: Once | OROMUCOSAL | Status: AC

## 2022-09-05 MED ORDER — SODIUM CHLORIDE 0.9 % IV SOLN: INTRAVENOUS | Status: DC

## 2022-09-05 MED ORDER — HYDROCHLOROTHIAZIDE 25 MG PO TABS
25.0000 mg | ORAL_TABLET | Freq: Every day | ORAL | Status: DC
  Administered 2022-09-05 – 2022-09-06 (×2): 25 mg via ORAL
  Filled 2022-09-05 (×2): qty 1

## 2022-09-05 MED ORDER — HYDROMORPHONE HCL 1 MG/ML IJ SOLN
0.2500 mg | INTRAMUSCULAR | Status: DC | PRN
  Administered 2022-09-05 (×4): 0.5 mg via INTRAVENOUS

## 2022-09-05 MED ORDER — METOCLOPRAMIDE HCL 5 MG PO TABS: 5.0000 mg | ORAL_TABLET | Freq: Three times a day (TID) | ORAL | Status: DC | PRN

## 2022-09-05 MED ORDER — HYDROMORPHONE HCL 1 MG/ML IJ SOLN
0.2500 mg | INTRAMUSCULAR | Status: DC | PRN
  Administered 2022-09-05 (×2): 0.5 mg via INTRAVENOUS

## 2022-09-05 MED ORDER — PROPOFOL 500 MG/50ML IV EMUL
INTRAVENOUS | Status: DC | PRN
  Administered 2022-09-05: 75 ug/kg/min via INTRAVENOUS

## 2022-09-05 MED ORDER — PROMETHAZINE HCL 25 MG/ML IJ SOLN: 6.2500 mg | INTRAMUSCULAR | Status: DC | PRN

## 2022-09-05 MED ORDER — ONDANSETRON HCL 4 MG PO TABS: 4.0000 mg | ORAL_TABLET | Freq: Four times a day (QID) | ORAL | Status: DC | PRN

## 2022-09-05 MED ORDER — PROPOFOL 10 MG/ML IV BOLUS
INTRAVENOUS | Status: DC | PRN
  Administered 2022-09-05 (×2): 20 ug via INTRAVENOUS

## 2022-09-05 MED ORDER — PANTOPRAZOLE SODIUM 40 MG PO TBEC
40.0000 mg | DELAYED_RELEASE_TABLET | Freq: Every day | ORAL | Status: DC
  Administered 2022-09-05 – 2022-09-06 (×2): 40 mg via ORAL
  Filled 2022-09-05 (×2): qty 1

## 2022-09-05 MED ORDER — LORATADINE 10 MG PO TABS
10.0000 mg | ORAL_TABLET | Freq: Every day | ORAL | Status: DC
  Administered 2022-09-05 – 2022-09-06 (×2): 10 mg via ORAL
  Filled 2022-09-05 (×2): qty 1

## 2022-09-05 MED ORDER — SIMVASTATIN 20 MG PO TABS
40.0000 mg | ORAL_TABLET | Freq: Every day | ORAL | Status: DC
  Administered 2022-09-05: 40 mg via ORAL
  Filled 2022-09-05: qty 2

## 2022-09-05 MED ORDER — METOCLOPRAMIDE HCL 5 MG/ML IJ SOLN: 5.0000 mg | Freq: Three times a day (TID) | INTRAMUSCULAR | Status: DC | PRN

## 2022-09-05 MED ORDER — CHLORHEXIDINE GLUCONATE 0.12 % MT SOLN: 15.0000 mL | Freq: Once | OROMUCOSAL | Status: AC

## 2022-09-05 MED ORDER — OXYCODONE HCL 5 MG PO TABS: 5.0000 mg | ORAL_TABLET | Freq: Once | ORAL | Status: DC | PRN

## 2022-09-05 MED ORDER — CHLORHEXIDINE GLUCONATE 0.12 % MT SOLN
OROMUCOSAL | Status: AC
  Administered 2022-09-05: 15 mL via OROMUCOSAL
  Filled 2022-09-05: qty 15

## 2022-09-05 MED ORDER — CARBOXYMETHYLCELLULOSE SODIUM 0.5 % OP SOLN: 1.0000 [drp] | Freq: Every day | OPHTHALMIC | Status: DC | PRN

## 2022-09-05 MED ORDER — DIPHENHYDRAMINE HCL 12.5 MG/5ML PO ELIX: 12.5000 mg | ORAL_SOLUTION | ORAL | Status: DC | PRN

## 2022-09-05 MED ORDER — LACTATED RINGERS IV SOLN: INTRAVENOUS | Status: DC | PRN

## 2022-09-05 MED ORDER — METHOCARBAMOL 1000 MG/10ML IJ SOLN: 500.0000 mg | Freq: Four times a day (QID) | INTRAVENOUS | Status: DC | PRN

## 2022-09-05 MED ORDER — ALUM & MAG HYDROXIDE-SIMETH 200-200-20 MG/5ML PO SUSP: 30.0000 mL | ORAL | Status: DC | PRN

## 2022-09-05 MED ORDER — POLYVINYL ALCOHOL 1.4 % OP SOLN: 1.0000 [drp] | Freq: Every day | OPHTHALMIC | Status: DC | PRN

## 2022-09-05 MED ORDER — POVIDONE-IODINE 10 % EX SWAB
2.0000 | Freq: Once | CUTANEOUS | Status: AC
  Administered 2022-09-05: 2 via TOPICAL

## 2022-09-05 MED ORDER — METHOCARBAMOL 500 MG PO TABS
500.0000 mg | ORAL_TABLET | Freq: Four times a day (QID) | ORAL | Status: DC | PRN
  Administered 2022-09-05 – 2022-09-06 (×4): 500 mg via ORAL
  Filled 2022-09-05 (×4): qty 1

## 2022-09-05 MED ORDER — OXYCODONE HCL 5 MG PO TABS: 10.0000 mg | ORAL_TABLET | ORAL | Status: DC | PRN

## 2022-09-05 MED ORDER — OXYCODONE HCL 5 MG/5ML PO SOLN: 5.0000 mg | Freq: Once | ORAL | Status: DC | PRN

## 2022-09-05 MED ORDER — DOCUSATE SODIUM 100 MG PO CAPS
100.0000 mg | ORAL_CAPSULE | Freq: Two times a day (BID) | ORAL | Status: DC
  Administered 2022-09-05 – 2022-09-06 (×3): 100 mg via ORAL
  Filled 2022-09-05 (×3): qty 1

## 2022-09-05 MED ORDER — ONDANSETRON HCL 4 MG/2ML IJ SOLN
4.0000 mg | Freq: Four times a day (QID) | INTRAMUSCULAR | Status: DC | PRN
  Administered 2022-09-05: 4 mg via INTRAVENOUS
  Filled 2022-09-05: qty 2

## 2022-09-05 MED ORDER — FENTANYL CITRATE (PF) 250 MCG/5ML IJ SOLN
INTRAMUSCULAR | Status: AC
  Filled 2022-09-05: qty 5

## 2022-09-05 MED ORDER — PHENOL 1.4 % MT LIQD: 1.0000 | OROMUCOSAL | Status: DC | PRN

## 2022-09-05 MED ORDER — FENTANYL CITRATE (PF) 250 MCG/5ML IJ SOLN
INTRAMUSCULAR | Status: DC | PRN
  Administered 2022-09-05: 50 ug via INTRAVENOUS

## 2022-09-05 MED ORDER — MIDAZOLAM HCL 2 MG/2ML IJ SOLN: 0.5000 mg | Freq: Once | INTRAMUSCULAR | Status: DC | PRN

## 2022-09-05 MED ORDER — ACETAMINOPHEN 325 MG PO TABS: 325.0000 mg | ORAL_TABLET | Freq: Four times a day (QID) | ORAL | Status: DC | PRN

## 2022-09-05 MED ORDER — BUPIVACAINE IN DEXTROSE 0.75-8.25 % IT SOLN
INTRATHECAL | Status: DC | PRN
  Administered 2022-09-05: 12 mg via INTRATHECAL

## 2022-09-05 SURGICAL SUPPLY — 48 items
APL SKNCLS STERI-STRIP NONHPOA (GAUZE/BANDAGES/DRESSINGS) ×1
BAG COUNTER SPONGE SURGICOUNT (BAG) ×1 IMPLANT
BAG SPNG CNTER NS LX DISP (BAG) ×1
BENZOIN TINCTURE PRP APPL 2/3 (GAUZE/BANDAGES/DRESSINGS) ×1 IMPLANT
BLADE SAW SGTL 18X1.27X75 (BLADE) ×1 IMPLANT
COVER SURGICAL LIGHT HANDLE (MISCELLANEOUS) ×1 IMPLANT
CUP SECTOR GRIPTON 50MM (Cup) IMPLANT
DRAPE C-ARM 42X72 X-RAY (DRAPES) ×1 IMPLANT
DRAPE STERI IOBAN 125X83 (DRAPES) ×1 IMPLANT
DRAPE U-SHAPE 47X51 STRL (DRAPES) ×3 IMPLANT
DRSG AQUACEL AG ADV 3.5X10 (GAUZE/BANDAGES/DRESSINGS) ×1 IMPLANT
DURAPREP 26ML APPLICATOR (WOUND CARE) ×1 IMPLANT
ELECT BLADE 4.0 EZ CLEAN MEGAD (MISCELLANEOUS) ×1
ELECT BLADE 6.5 EXT (BLADE) IMPLANT
ELECT REM PT RETURN 9FT ADLT (ELECTROSURGICAL) ×1
ELECTRODE BLDE 4.0 EZ CLN MEGD (MISCELLANEOUS) ×1 IMPLANT
ELECTRODE REM PT RTRN 9FT ADLT (ELECTROSURGICAL) ×1 IMPLANT
FACESHIELD WRAPAROUND (MASK) ×2 IMPLANT
FACESHIELD WRAPAROUND OR TEAM (MASK) ×2 IMPLANT
GLOVE BIOGEL PI IND STRL 8 (GLOVE) ×2 IMPLANT
GLOVE ECLIPSE 8.0 STRL XLNG CF (GLOVE) ×1 IMPLANT
GLOVE ORTHO TXT STRL SZ7.5 (GLOVE) ×2 IMPLANT
GOWN STRL REUS W/ TWL LRG LVL3 (GOWN DISPOSABLE) ×2 IMPLANT
GOWN STRL REUS W/ TWL XL LVL3 (GOWN DISPOSABLE) ×2 IMPLANT
GOWN STRL REUS W/TWL LRG LVL3 (GOWN DISPOSABLE) ×2
GOWN STRL REUS W/TWL XL LVL3 (GOWN DISPOSABLE) ×2
HANDPIECE INTERPULSE COAX TIP (DISPOSABLE) ×1
HEAD FEMORAL 32 CERAMIC (Hips) IMPLANT
KIT BASIN OR (CUSTOM PROCEDURE TRAY) ×1 IMPLANT
KIT TURNOVER KIT B (KITS) ×1 IMPLANT
LINER ACETABULAR 32X50 (Liner) IMPLANT
MANIFOLD NEPTUNE II (INSTRUMENTS) ×1 IMPLANT
NS IRRIG 1000ML POUR BTL (IV SOLUTION) ×1 IMPLANT
PACK TOTAL JOINT (CUSTOM PROCEDURE TRAY) ×1 IMPLANT
PAD ARMBOARD 7.5X6 YLW CONV (MISCELLANEOUS) ×1 IMPLANT
SET HNDPC FAN SPRY TIP SCT (DISPOSABLE) ×1 IMPLANT
STEM FEM SZ3 STD ACTIS (Stem) IMPLANT
STRIP CLOSURE SKIN 1/2X4 (GAUZE/BANDAGES/DRESSINGS) ×2 IMPLANT
SUT ETHIBOND NAB CT1 #1 30IN (SUTURE) ×1 IMPLANT
SUT VIC AB 0 CT1 27 (SUTURE) ×1
SUT VIC AB 0 CT1 27XBRD ANBCTR (SUTURE) ×1 IMPLANT
SUT VIC AB 1 CT1 27 (SUTURE) ×1
SUT VIC AB 1 CT1 27XBRD ANBCTR (SUTURE) ×1 IMPLANT
SUT VIC AB 2-0 CT1 27 (SUTURE) ×1
SUT VIC AB 2-0 CT1 TAPERPNT 27 (SUTURE) ×1 IMPLANT
TOWEL GREEN STERILE (TOWEL DISPOSABLE) ×1 IMPLANT
TOWEL GREEN STERILE FF (TOWEL DISPOSABLE) ×1 IMPLANT
WATER STERILE IRR 1000ML POUR (IV SOLUTION) ×2 IMPLANT

## 2022-09-05 NOTE — Transfer of Care (Signed)
Immediate Anesthesia Transfer of Care Note  Patient: Amy Dennis  Procedure(s) Performed: RIGHT TOTAL HIP ARTHROPLASTY ANTERIOR APPROACH (Right: Hip)  Patient Location: PACU  Anesthesia Type:MAC and Spinal  Level of Consciousness: awake and alert   Airway & Oxygen Therapy: Patient Spontanous Breathing and Patient connected to face mask oxygen  Post-op Assessment: Report given to RN and Post -op Vital signs reviewed and stable  Post vital signs: Reviewed and stable  Last Vitals:  Vitals Value Taken Time  BP 116/68 09/05/22 1051  Temp    Pulse 71 09/05/22 1053  Resp 14 09/05/22 1053  SpO2 99 % 09/05/22 1053  Vitals shown include unvalidated device data.  Last Pain:  Vitals:   09/05/22 0751  TempSrc:   PainSc: 0-No pain         Complications: No notable events documented.

## 2022-09-05 NOTE — Anesthesia Procedure Notes (Signed)
Spinal  Patient location during procedure: OR End time: 09/05/2022 9:19 AM Staffing Performed: anesthesiologist  Anesthesiologist: Jairo Ben, MD Performed by: Jairo Ben, MD Authorized by: Jairo Ben, MD   Preanesthetic Checklist Completed: patient identified, IV checked, site marked, risks and benefits discussed, surgical consent, monitors and equipment checked, pre-op evaluation and timeout performed Spinal Block Patient position: sitting Prep: DuraPrep and site prepped and draped Patient monitoring: heart rate, cardiac monitor, continuous pulse ox and blood pressure Approach: midline Location: L3-4 Injection technique: single-shot Needle Needle type: Pencan and Introducer  Needle gauge: 24 G Needle length: 9 cm Assessment Events: CSF return Additional Notes Pt identified in Operating room.  Monitors applied. Working IV access confirmed. Sterile prep, drape lumbar spine.  1% lido local L 3,4.  #24ga Pencan into clear CSF L 3,4.   0.75% Bupivacaine with dextrose injected with asp CSF beginning and end of injection.  Patient asymptomatic, VSS, no heme aspirated, tolerated well.  Sandford Craze, MD

## 2022-09-05 NOTE — Anesthesia Procedure Notes (Signed)
Procedure Name: MAC Date/Time: 09/05/2022 9:25 AM  Performed by: Caren Macadam, CRNAPre-anesthesia Checklist: Patient identified, Emergency Drugs available, Suction available, Patient being monitored and Timeout performed Patient Re-evaluated:Patient Re-evaluated prior to induction Oxygen Delivery Method: Simple face mask Preoxygenation: Pre-oxygenation with 100% oxygen Induction Type: IV induction

## 2022-09-05 NOTE — Anesthesia Postprocedure Evaluation (Signed)
Anesthesia Post Note  Patient: Amy Dennis  Procedure(s) Performed: RIGHT TOTAL HIP ARTHROPLASTY ANTERIOR APPROACH (Right: Hip)     Patient location during evaluation: PACU Anesthesia Type: Spinal Level of consciousness: awake and alert, patient cooperative and oriented Pain management: pain level controlled Vital Signs Assessment: post-procedure vital signs reviewed and stable Respiratory status: nonlabored ventilation, spontaneous breathing and respiratory function stable Cardiovascular status: blood pressure returned to baseline and stable Postop Assessment: no apparent nausea or vomiting and spinal receding Anesthetic complications: no   No notable events documented.  Last Vitals:  Vitals:   09/05/22 1200 09/05/22 1215  BP: 124/62 109/72  Pulse: 64 63  Resp: 12 15  Temp:    SpO2: 94% 92%    Last Pain:  Vitals:   09/05/22 1215  TempSrc:   PainSc: Asleep    LLE Motor Response: Purposeful movement;Responds to commands (09/05/22 1215) LLE Sensation: Full sensation (09/05/22 1215) RLE Motor Response: Purposeful movement;Responds to commands (09/05/22 1215) RLE Sensation: Full sensation (09/05/22 1215) L Sensory Level: S1-Sole of foot, small toes (09/05/22 1215) R Sensory Level: S1-Sole of foot, small toes (09/05/22 1215)  Myrikal Messmer,E. Doyt Castellana

## 2022-09-05 NOTE — Progress Notes (Signed)
Patient ID: Amy Dennis, female   DOB: Nov 26, 1950, 72 y.o.   MRN: [id number] Awake and comfortable. Dressing clean ,dry and intact. Right ankle dorsi /plantar flexion intact.

## 2022-09-05 NOTE — Op Note (Signed)
Operative Note  Date of operation: 09/05/2022 Preoperative diagnosis: Right hip primary osteoarthritis Postoperative diagnosis: Same  Procedure: Right primary direct anterior total hip arthroplasty  Implants: Implant Name Type Inv. Item Serial No. Manufacturer Lot No. LRB No. Used Action  CUP SECTOR GRIPTON - [id number] Cup CUP SECTOR GRIPTON  [employer]PU[employer] [id number] Right 1 Implanted  LINER ACETABULAR 32X50 - [id number] Liner LINER ACETABULAR 32X50  DEPUY ORTHOPA[employer] [id number] Right 1 Implanted  STEM FEM SZ3 STD ACTIS - [id number] Stem STEM FEM SZ3 STD ACTIS  DEPUY ORTHOPAEDICS [id number] Right 1 Implanted  HEAD FEMORAL 32 CERAMIC - [id number] Hips HEAD FEMORAL 32 CERAMIC  DEPUY ORTH[employer] [id number] Right 1 Implanted   Surgeon: [name]. [name], MD Assistant: Rexene Edison, PA-C  Anesthesia: Spinal Antibiotics: IV Ancef EBL: 100 cc Complications: None  Indications: The patient is a very active 72 year old female with debilitating arthritis involving her right hip.  Her pain has become quite significant with that right hip.  Her x-rays show severe end-stage arthritis.  At this point her right hip pain is daily and it is detrimentally affecting her mobility, her quality of life and her actives day living to the point she wishes to proceed with a hip replacement and we agree with this as well based on her radiographic and clinical exam findings, the very conservative treatment.  We talked in length in detail about the risk of acute blood loss anemia, nerve or vessel injury, fracture, infection, DVT, dislocation, implant failure, leg length differences and wound healing issues.  She understands her goals are hopefully decrease pain, improve mobility, and improve quality of life.  Procedure description: After informed consent was obtained and the appropriate right hip was marked, the patient was brought to the operating room and sat up on the stretcher where spinal anesthesia was  obtained.  She was then laid in supine position on the stretcher and a Foley catheter was placed.  Traction boots were placed on both her feet and then she was placed supine on the Hana fracture table with a perineal post in place in both legs and inline skeletal traction vices but no traction applied.  Her right operative hip and pelvis were assessed radiographically.  We then had the right hip prepped and draped with DuraPrep and sterile drapes.  A timeout was called and she was benefits correct patient correct right hip.  An incision was then made just inferior and posterior to the ASIS and carried slightly obliquely down the leg.  Dissection was carried down to the tensor fascia lata muscle and the tensor fascia was then divided longitudinally to proceed with a direct interposed the hip.  Circumflex vessels were identified cauterized and hip capsule was identified and opened up in L-type format finding a moderate joint effusion and significant arthritis around her right hip.  Cobra retractors were placed around the medial and lateral femoral neck and a femoral neck cut was made with an oscillating saw just proximal to the lesser trochanter.  This cut was completed with an osteotome.  A corkscrew guide was placed in the femoral head and the femoral head was removed in its entirety and there was a wide area devoid of cartilage.  A bent Hohmann was then placed over the medial acetabular rim and remnants of the acetabular labrum and other debris removed.  Reaming was then initiated from a size 43 reamer and stepwise increments going up to a size 49 reamer with all reamers placed under direct visualization and the last  reamer was also placed under direct fluoroscopy in order to obtain the depth of reaming, the inclination and the anteversion.  The real DePuy sector GRIPTION acetabular component size 50 was then placed again without difficulty.  A 32+0 neutral polythene liner was placed for that size 50 acetabular  component.  Attention was then turned to the femur.  With the right leg externally rotated to 120 degrees, extended and adducted, a Mueller retractor was placed medially and a Hohmann retractor behind the greater trochanter.  The lateral joint capsule was released and a box cutting osteotome was used in her femoral canal.  Broaching was then initiated from a size 0 broach and stepwise increments going up to a size 3 broach.  We then trialed a standard offset femoral neck and a 32+1 trial hip ball.  This was reduced in the pelvis and we are pleased with stability and offset but we did lengthen her a little bit.  There was no way we could go backwards due to instability and we had the smallest length femoral head and the lowest offset liner of the acetabulum.  We dislocated the hip and remove the trial components.  We placed the real Actis femoral component size 3 with standard offset and the real 32+1 ceramic head ball.  Again this was reduced in the acetabulum and replaced with stability.  We then irrigated the soft tissue with normal saline solution.  The joint capsule was closed with interrupted #1 Ethibond suture #1 Vicryl was used to close the tensor fascia.  0 Vicryl is used to close the deep tissue and 2-0 Vicryl is used to close the subcutaneous tissue..  The skin was closed with staples.  An Aquacel dressing was applied.  The patient was taken off on table and taken recovery room in stable condition.  Rexene Edison, PA-C did assist during the entire case and beginning to end and his assistance was crucial and medically necessary for soft tissue management and retraction, helping guide implant placement and a layered closure of the wound.

## 2022-09-05 NOTE — Interval H&P Note (Signed)
History and Physical Interval Note: The patient understands that she is here today for right hip replacement to treat her severe right hip arthritis.  There has been no acute or interval change in her medical status.  See H&P.  The risks and benefits of surgery have been explained in detail and informed consent is obtained.  The right operative hip has been marked.  09/05/2022 7:31 AM  Amy Dennis  has presented today for surgery, with the diagnosis of endstage osteoarthritis right hip.  The various methods of treatment have been discussed with the patient and family. After consideration of risks, benefits and other options for treatment, the patient has consented to  Procedure(s): RIGHT TOTAL HIP ARTHROPLASTY ANTERIOR APPROACH (Right) as a surgical intervention.  The patient's history has been reviewed, patient examined, no change in status, stable for surgery.  I have reviewed the patient's chart and labs.  Questions were answered to the patient's satisfaction.     Kathryne Hitch

## 2022-09-05 NOTE — Evaluation (Signed)
Physical Therapy Evaluation Patient Details Name: Amy Dennis MRN: [id number] BARBAR2, 1952 Today's Date: 09/05/2022  History of Present Illness  Pt is 72 year old presented to [facility] on  09/05/22 for rt THR. PMH - HTN, osteoarthritis.  Clinical Impression  Pt admitted with above diagnosis and presents to PT with functional limitations due to deficits listed below (See PT problem list). Pt needs skilled PT to maximize independence and safety. Pt plans to return home with family. Should make excellent progress.          Recommendations for follow up therapy are one component of a multi-disciplinary discharge planning process, led by the attending physician.  Recommendations may be updated based on patient status, additional functional criteria and insurance authorization.  Follow Up Recommendations       Assistance Recommended at Discharge Intermittent Supervision/Assistance  Patient can return home with the following  Help with stairs or ramp for entrance;Assistance with cooking/housework;A little help with bathing/dressing/bathroom    Equipment Recommendations Rolling walker (2 wheels)  Recommendations for Other Services       Functional Status Assessment Patient has had a recent decline in their functional status and demonstrates the ability to make significant improvements in function in a reasonable and predictable amount of time.     Precautions / Restrictions Precautions Precautions: None Restrictions Weight Bearing Restrictions: Yes RLE Weight Bearing: Weight bearing as tolerated      Mobility  Bed Mobility Overal bed mobility: Needs Assistance Bed Mobility: Supine to Sit     Supine to sit: Min assist, HOB elevated     General bed mobility comments: Assist to move RLE off of the bed    Transfers Overall transfer level: Needs assistance Equipment used: Rolling walker (2 wheels) Transfers: Sit to/from Stand Sit to Stand: Min guard           General  transfer comment: Assist for safety and verbal cues for hand placement    Ambulation/Gait Ambulation/Gait assistance: Min guard Gait Distance (Feet): 125 Feet Assistive device: Rolling walker (2 wheels) Gait Pattern/deviations: Step-through pattern, Decreased stance time - right, Decreased step length - left, Antalgic Gait velocity: decr Gait velocity interpretation: <1.31 ft/sec, indicative of household ambulator   General Gait Details: Assist for safety and Armed forces training and education officer    Modified Rankin (Stroke Patients Only)       Balance Overall balance assessment: No apparent balance deficits (not formally assessed)                                           Pertinent Vitals/Pain Pain Assessment Pain Assessment: Faces Faces Pain Scale: Hurts even more Pain Location: rt hip Pain Descriptors / Indicators: Grimacing, Operative site guarding Pain Intervention(s): Limited activity within patient's tolerance, Monitored during session, Repositioned, Premedicated before session    Home Living Family/patient expects to be discharged to:: Private residence Living Arrangements: Spouse/significant other Available Help at Discharge: Family;Available 24 hours/day Type of Home: House Home Access: Stairs to enter Entrance Stairs-Rails: None (Garage steps has a wall next to it that she can hold) Entrance Stairs-Number of Steps: 2   Home Layout: Two level;Able to live on main level with bedroom/bathroom;Laundry or work area in [employer] Equipment: Gilmer Mor - single point;BSC/3in1 (Has borrowed these from a friend)      Prior Function Prior  Level of Function : Independent/Modified Independent             Mobility Comments: No assistive device       Hand Dominance        Extremity/Trunk Assessment   Upper Extremity Assessment Upper Extremity Assessment: Overall WFL for tasks assessed    Lower Extremity Assessment Lower  Extremity Assessment: RLE deficits/detail RLE Deficits / Details: Limited by expected post op pain       Communication   Communication: No difficulties  Cognition Arousal/Alertness: Awake/alert Behavior During Therapy: WFL for tasks assessed/performed Overall Cognitive Status: Within Functional Limits for tasks assessed                                          General Comments      Exercises     Assessment/Plan    PT Assessment Patient needs continued PT services  PT Problem List Decreased strength;Decreased mobility;Pain       PT Treatment Interventions DME instruction;Gait training;Functional mobility training;Stair training;Therapeutic activities;Therapeutic exercise;Patient/family education    PT Goals (Current goals can be found in the Care Plan section)  Acute Rehab PT Goals Patient Stated Goal: get back to doing what she wants to PT Goal Formulation: With patient Time For Goal Achievement: 09/08/22 Potential to Achieve Goals: Good    Frequency 7X/week     Co-evaluation               AM-PAC PT "6 Clicks" Mobility  Outcome Measure Help needed turning from your back to your side while in a flat bed without using bedrails?: A Little Help needed moving from lying on your back to sitting on the side of a flat bed without using bedrails?: A Little Help needed moving to and from a bed to a chair (including a wheelchair)?: A Little Help needed standing up from a chair using your arms (e.g., wheelchair or bedside chair)?: A Little Help needed to walk in hospital room?: A Little Help needed climbing 3-5 steps with a railing? : A Little 6 Click Score: 18    End of Session Equipment Utilized During Treatment: Gait belt Activity Tolerance: Patient tolerated treatment well Patient left: in chair;with call bell/phone within reach Nurse Communication: Mobility status;Other (comment) (request for antinausea meds) PT Visit Diagnosis: Other  abnormalities of gait and mobility (R26.89);Pain Pain - Right/Left: Right Pain - part of body: Hip    Time: 1533-1600 PT Time Calculation (min) (ACUTE ONLY): 27 min   Charges:   PT Evaluation $PT Eval Low Complexity: 1 Low PT Treatments $Gait Training: 8-22 mins        [facility] PT Acute Rehabilitation Services Office [phone]   Angelina Ok [facility] 09/05/2022, 4:17 PM

## 2022-09-06 ENCOUNTER — Encounter (HOSPITAL_COMMUNITY): Payer: Self-pay | Admitting: Orthopaedic Surgery

## 2022-09-06 DIAGNOSIS — M1611 Unilateral primary osteoarthritis, right hip: Secondary | ICD-10-CM | POA: Diagnosis not present

## 2022-09-06 LAB — CBC
HCT: 29.3 % — ABNORMAL LOW (ref 36.0–46.0)
Hemoglobin: 9.7 g/dL — ABNORMAL LOW (ref 12.0–15.0)
MCH: 28.7 pg (ref 26.0–34.0)
MCHC: 33.1 g/dL (ref 30.0–36.0)
MCV: 86.7 fL (ref 80.0–100.0)
Platelets: 169 10*3/uL (ref 150–400)
RBC: 3.38 MIL/uL — ABNORMAL LOW (ref 3.87–5.11)
RDW: 13.2 % (ref 11.5–15.5)
WBC: 7.7 10*3/uL (ref 4.0–10.5)
nRBC: 0 % (ref 0.0–0.2)

## 2022-09-06 LAB — BASIC METABOLIC PANEL
Anion gap: 10 (ref 5–15)
BUN: 9 mg/dL (ref 8–23)
CO2: 27 mmol/L (ref 22–32)
Calcium: 8.2 mg/dL — ABNORMAL LOW (ref 8.9–10.3)
Chloride: 91 mmol/L — ABNORMAL LOW (ref 98–111)
Creatinine, Ser: 0.74 mg/dL (ref 0.44–1.00)
GFR, Estimated: >60 mL/min (ref >60)
Glucose, Bld: 128 mg/dL — ABNORMAL HIGH (ref 70–99)
Potassium: 3.5 mmol/L (ref 3.5–5.1)
Sodium: 128 mmol/L — ABNORMAL LOW (ref 135–145)

## 2022-09-06 MED ORDER — ASPIRIN 81 MG PO CHEW: 81.0000 mg | CHEWABLE_TABLET | Freq: Two times a day (BID) | ORAL | 0 refills | Status: AC

## 2022-09-06 MED ORDER — OXYCODONE HCL 5 MG PO TABS: 5.0000 mg | ORAL_TABLET | Freq: Four times a day (QID) | ORAL | 0 refills | Status: AC | PRN

## 2022-09-06 MED ORDER — METHOCARBAMOL 500 MG PO TABS: 500.0000 mg | ORAL_TABLET | Freq: Four times a day (QID) | ORAL | 1 refills | Status: AC | PRN

## 2022-09-06 NOTE — Discharge Summary (Signed)
Patient ID: Amy Dennis MRN: [id number] DOB/AGE: [birth year] 72 y.o.  Admit date: 09/05/2022 Discharge date: 09/06/2022  Admission Diagnoses:  Principal Problem:   Unilateral primary osteoarthritis, right hip Active Problems:   Status post total replacement of right hip   Discharge Diagnoses:  Same  Past Medical History:  Diagnosis Date   Allergy    Arthritis    [employer] 03/17/2020   Heart murmur    Hyperlipidemia    Hypertension     Surgeries: Procedure(s): RIGHT TOTAL HIP ARTHROPLASTY ANTERIOR APPROACH on 09/05/2022   Consultants:   Discharged Condition: Improved  Hospital Course: Amy Dennis is an 72 y.o. female who was admitted 09/05/2022 for operative treatment ofUnilateral primary osteoarthritis, right hip. Patient has severe unremitting pain that affects sleep, daily activities, and work/hobbies. After pre-op clearance the patient was taken to the operating room on 09/05/2022 and underwent  Procedure(s): RIGHT TOTAL HIP ARTHROPLASTY ANTERIOR APPROACH.    Patient was given perioperative antibiotics:  Anti-infectives (From admission, onward)    Start     Dose/Rate Route Frequency Ordered Stop   09/05/22 1300  ceFAZolin (ANCEF) IVPB 1 g/50 mL premix        1 g 100 mL/hr over 30 Minutes Intravenous Every 6 hours 09/05/22 1254 09/05/22 1911   09/05/22 0745  ceFAZolin (ANCEF) IVPB 2g/100 mL premix        2 g 200 mL/hr over 30 Minutes Intravenous On call to O.R. 09/05/22 4098 09/05/22 1191        Patient was given sequential compression devices, early ambulation, and chemoprophylaxis to prevent DVT.  Patient benefited maximally from hospital stay and there were no complications.    Recent vital signs: Patient Vitals for the past 24 hrs:  BP Temp Temp src Pulse Resp SpO2  09/06/22 0733 111/63 98.4 F (36.9 C) Oral 72 18 96 %  09/06/22 0231 121/85 98.7 F (37.1 C) Oral 77 20 99 %  09/06/22 0043 112/74 98.4 F (36.9 C) Oral 78 18 98 %  09/05/22 2054 113/75  98.4 F (36.9 C) Oral 70 18 98 %  09/05/22 1640 101/63 (!) 97.4 F (36.3 C) Oral 66 20 98 %  09/05/22 1300 126/72 98 F (36.7 C) Oral 63 20 98 %  09/05/22 1245 109/64 98.3 F (36.8 C) -- (!) 58 13 99 %  09/05/22 1230 110/76 -- -- 66 17 92 %  09/05/22 1215 109/72 -- -- 63 15 92 %  09/05/22 1200 124/62 -- -- 64 12 94 %  09/05/22 1145 123/73 -- -- 65 11 98 %  09/05/22 1130 122/67 -- -- 66 13 91 %  09/05/22 1115 112/75 -- -- 68 12 99 %  09/05/22 1100 110/78 -- -- 70 18 100 %  09/05/22 1050 116/68 98.4 F (36.9 C) -- 70 18 100 %     Recent laboratory studies:  Recent Labs    09/06/22 0627  WBC 7.7  HGB 9.7*  HCT 29.3*  PLT 169  NA 128*  K 3.5  CL 91*  CO2 27  BUN 9  CREATININE 0.74  GLUCOSE 128*  CALCIUM 8.2*     Discharge Medications:   Allergies as of 09/06/2022       Reactions   Mobic [meloxicam] Diarrhea        Medication List     STOP taking these medications    diclofenac 75 MG EC tablet Commonly known as: VOLTAREN       TAKE these medications    aspirin  81 MG chewable tablet Chew 1 tablet (81 mg total) by mouth 2 (two) times daily.   BIOFREEZE EX Apply 1 Application topically 3 (three) times daily as needed (pain.).   CALCIUM-VITAMIN D PO Take 1 tablet by mouth in the morning.   carboxymethylcellulose 0.5 % Soln Commonly known as: REFRESH PLUS Place 1 drop into both eyes daily as needed (dry/irritated eyes.).   cholecalciferol 25 MCG (1000 UNIT) tablet Commonly known as: VITAMIN D3 Take 1,000 Units by mouth in the morning.   Fiber Choice Prebiotic Fiber 1.5 g Chew Generic drug: Inulin Chew 1.5 g by mouth in the morning.   fluticasone 50 MCG/ACT nasal spray Commonly known as: FLONASE Use 2 spray(s) in each nostril once daily   hydrochlorothiazide 25 MG tablet Commonly known as: HYDRODIURIL Take 1 tablet by mouth once daily   loratadine 10 MG tablet Commonly known as: CLARITIN Take 10 mg by mouth daily.   Melatonin 10 MG  Tabs Take 10 mg by mouth at bedtime as needed (for sleep).   methocarbamol 500 MG tablet Commonly known as: ROBAXIN Take 1 tablet (500 mg total) by mouth every 6 (six) hours as needed for muscle spasms.   oxyCODONE 5 MG immediate release tablet Commonly known as: Oxy IR/ROXICODONE Take 1-2 tablets (5-10 mg total) by mouth every 6 (six) hours as needed for moderate pain (pain score 4-6).   Salonpas Pain Relieving 4 % Generic drug: lidocaine Place 1 patch onto the skin daily as needed (PAIN.).   simvastatin 40 MG tablet Commonly known as: ZOCOR TAKE 1 TABLET BY MOUTH AT BEDTIME               Durable Medical Equipment  (From admission, onward)           Start     Ordered   09/05/22 1311  DME 3 n 1  Once        09/05/22 1310   09/05/22 1311  DME Walker rolling  Once       Question Answer Comment  Walker: With 5 Inch Wheels   Patient needs a walker to treat with the following condition Status post total replacement of right hip      09/05/22 1310            Diagnostic Studies: DG Pelvis Portable  Result Date: 09/05/2022 CLINICAL DATA:  Status post hip arthroplasty EXAM: PORTABLE PELVIS 1 VIEWS COMPARISON:  None Available. FINDINGS: Expected postsurgical changes from recent right hip arthroplasty including subcutaneous emphysema. Skin staples overlie the right hip. Normal alignment. Assessment of the sacrum is limited due to overlying bowel gas. Symmetric SI joints. IMPRESSION: Expected postsurgical changes from right hip arthroplasty. Electronically Signed   By: Lorenza Cambridge M.D.   On: 09/05/2022 13:58   DG HIP UNILAT WITH PELVIS 1V RIGHT  Result Date: 09/05/2022 CLINICAL DATA:  Hip replacement EXAM: Intraoperative fluoroscopy COMPARISON:  Preop x-ray 04/27/2022 FINDINGS: Three fluoroscopic spot images submitted for review demonstrates placement of hip arthroplasty with Press-Fit components. Expected alignment. Imaging was obtained to aid in treatment. Please  correlate with real-time fluoroscopy of 25.1 seconds. Cumulative dose 1.77 mGy IMPRESSION: Intraoperative fluoroscopy Electronically Signed   By: Karen Kays M.D.   On: 09/05/2022 11:19   DG C-Arm 1-60 Min-No Report  Result Date: 09/05/2022 Fluoroscopy was utilized by the requesting physician.  No radiographic interpretation.    Disposition: Discharge disposition: 06-Home-Health Care Svc          Follow-up Information  [facility] Follow up.   Specialty: Home Health Services Why: The home health agency will contact you for the first home visit. Contact information: [address] [place] [address] [phone]         Kathryne Hitch, MD Follow up in 2 week(s).   Specialty: Orthopedic Surgery Contact information: [address] [place] [address] [phone]                  Signed: Richardean Canal 09/06/2022, 8:35 AM

## 2022-09-06 NOTE — Progress Notes (Signed)
Subjective: 1 Day Post-Op Procedure(s) (LRB): RIGHT TOTAL HIP ARTHROPLASTY ANTERIOR APPROACH (Right) Patient reports pain as moderate.    Objective: Vital signs in last 24 hours: Temp:  [97.4 F (36.3 C)-98.7 F (37.1 C)] 98.4 F (36.9 C) (04/24 0733) Pulse Rate:  [58-78] 72 (04/24 0733) Resp:  [11-20] 18 (04/24 0733) BP: (101-126)/(62-85) 111/63 (04/24 0733) SpO2:  [91 %-100 %] 96 % (04/24 0733)  Intake/Output from previous day: 04/23 0701 - 04/24 0700 In: 1080 [P.O.:480; I.V.:600] Out: 975 [Urine:875; Blood:100] Intake/Output this shift: No intake/output data recorded.  Recent Labs    09/06/22 0627  HGB 9.7*   Recent Labs    09/06/22 0627  WBC 7.7  RBC 3.38*  HCT 29.3*  PLT 169   No results for input(s): "NA", "K", "CL", "CO2", "BUN", "CREATININE", "GLUCOSE", "CALCIUM" in the last 72 hours. No results for input(s): "LABPT", "INR" in the last 72 hours.  Dorsiflexion/Plantar flexion intact Incision: scant drainage Compartment soft   Assessment/Plan: 1 Day Post-Op Procedure(s) (LRB): RIGHT TOTAL HIP ARTHROPLASTY ANTERIOR APPROACH (Right) Up with therapy Discharge home with home health      Amy Dennis 09/06/2022, 7:56 AM

## 2022-09-06 NOTE — Progress Notes (Signed)
Physical Therapy Treatment Patient Details Name: Amy Dennis MRN: [id number] DOB: 1950/06/14 Today's Date: 09/06/2022   History of Present Illness Pt is 72 y/o female who presents s/p R THA on 09/05/2022. PMH - HTN, osteoarthritis.    PT Comments    Pt progressing towards physical therapy goals. Focus of session was gait training, stair training, and getting dressed for d/c. Pt was instructed in LB dressing. Pt anticipates d/c when ride arrives. Will continue to follow and progress as able per POC.     Recommendations for follow up therapy are one component of a multi-disciplinary discharge planning process, led by the attending physician.  Recommendations may be updated based on patient status, additional functional criteria and insurance authorization.  Follow Up Recommendations       Assistance Recommended at Discharge Intermittent Supervision/Assistance  Patient can return home with the following Help with stairs or ramp for entrance;Assistance with cooking/housework;A little help with bathing/dressing/bathroom   Equipment Recommendations  Rolling walker (2 wheels)    Recommendations for Other Services       Precautions / Restrictions Precautions Precautions: None Restrictions Weight Bearing Restrictions: Yes RLE Weight Bearing: Weight bearing as tolerated     Mobility  Bed Mobility               General bed mobility comments: Pt was received sitting up in recliner    Transfers Overall transfer level: Needs assistance Equipment used: Rolling walker (2 wheels) Transfers: Sit to/from Stand Sit to Stand: Min guard           General transfer comment: VC's for hand placement on seated surface for safety, kicking RLE out prior to initiating transfers.    Ambulation/Gait Ambulation/Gait assistance: Min guard Gait Distance (Feet): 100 Feet Assistive device: Rolling walker (2 wheels) Gait Pattern/deviations: Step-through pattern, Decreased stance time -  right, Decreased step length - left, Antalgic Gait velocity: Decreased Gait velocity interpretation: 1.31 - 2.62 ft/sec, indicative of limited community ambulator   General Gait Details: VC's for improved posture, closer walker proximity, and forward gaze. Pt with decreased heel strike and difficulty with step-through gait pattern. Unable to maintain equal step/stride length R and L.   Stairs Stairs: Yes Stairs assistance: Min guard Stair Management: One rail Right, Step to pattern, Sideways Number of Stairs: 4 General stair comments: VC's for sequencing and general safety.   Wheelchair Mobility    Modified Rankin (Stroke Patients Only)       Balance Overall balance assessment: No apparent balance deficits (not formally assessed)                                          Cognition Arousal/Alertness: Awake/alert Behavior During Therapy: WFL for tasks assessed/performed Overall Cognitive Status: Within Functional Limits for tasks assessed                                          Exercises Total Joint Exercises Quad Sets: 10 reps Towel Squeeze: 10 reps Short Arc Quad: 10 reps Heel Slides: 10 reps Hip ABduction/ADduction: 10 reps Long Arc Quad: 10 reps    General Comments        Pertinent Vitals/Pain Pain Assessment Pain Assessment: Faces Faces Pain Scale: Hurts little more Pain Location: rt hip Pain Descriptors / Indicators: Grimacing, Operative site  guarding, Sore Pain Intervention(s): Limited activity within patient's tolerance, Monitored during session, Repositioned    Home Living                          Prior Function            PT Goals (current goals can now be found in the care plan section) Acute Rehab PT Goals Patient Stated Goal: get back to doing what she wants to PT Goal Formulation: With patient Time For Goal Achievement: 09/08/22 Potential to Achieve Goals: Good Progress towards PT goals:  Progressing toward goals    Frequency    7X/week      PT Plan Current plan remains appropriate    Co-evaluation              AM-PAC PT "6 Clicks" Mobility   Outcome Measure  Help needed turning from your back to your side while in a flat bed without using bedrails?: A Little Help needed moving from lying on your back to sitting on the side of a flat bed without using bedrails?: A Little Help needed moving to and from a bed to a chair (including a wheelchair)?: A Little Help needed standing up from a chair using your arms (e.g., wheelchair or bedside chair)?: A Little Help needed to walk in hospital room?: A Little Help needed climbing 3-5 steps with a railing? : A Little 6 Click Score: 18    End of Session Equipment Utilized During Treatment: Gait belt Activity Tolerance: Patient tolerated treatment well Patient left: in chair;with call bell/phone within reach Nurse Communication: Mobility status PT Visit Diagnosis: Other abnormalities of gait and mobility (R26.89);Pain Pain - Right/Left: Right Pain - part of body: Hip     Time: 1610-9604 PT Time Calculation (min) (ACUTE ONLY): 28 min  Charges:  $Gait Training: 23-37 mins                     Conni Slipper, PT, DPT Acute Rehabilitation Services Secure Chat Preferred Office: [phone]    Amy Dennis 09/06/2022, 1:42 PM

## 2022-09-06 NOTE — Progress Notes (Signed)
Physical Therapy Treatment Patient Details Name: Amy Dennis MRN: [id number] DOB: Feb 15, 1951 Today's Date: 09/06/2022   History of Present Illness Pt is 72 year old presented to [facility] on  09/05/22 for rt THR. PMH - HTN, osteoarthritis.    PT Comments    Pt progressing towards physical therapy goals. Continues to have difficulty with smooth step-through gait pattern but able to ambulate in the hallway without physical assistance. Reviewed HEP and issued handout. Will need stair training prior to d/c and would benefit from further gait training.     Recommendations for follow up therapy are one component of a multi-disciplinary discharge planning process, led by the attending physician.  Recommendations may be updated based on patient status, additional functional criteria and insurance authorization.  Follow Up Recommendations       Assistance Recommended at Discharge Intermittent Supervision/Assistance  Patient can return home with the following Help with stairs or ramp for entrance;Assistance with cooking/housework;A little help with bathing/dressing/bathroom   Equipment Recommendations  Rolling walker (2 wheels)    Recommendations for Other Services       Precautions / Restrictions Precautions Precautions: None Restrictions Weight Bearing Restrictions: Yes RLE Weight Bearing: Weight bearing as tolerated     Mobility  Bed Mobility               General bed mobility comments: Pt was received sitting up in recliner    Transfers Overall transfer level: Needs assistance Equipment used: Rolling walker (2 wheels) Transfers: Sit to/from Stand Sit to Stand: Min guard           General transfer comment: VC's for hand placement on seated surface for safety, kicking RLE out prior to initiating transfers. Close guard for safety when doing these things, but with uncontrolled descent to the toilet when not kicking RLE out.    Ambulation/Gait Ambulation/Gait  assistance: Min guard Gait Distance (Feet): 120 Feet Assistive device: Rolling walker (2 wheels) Gait Pattern/deviations: Step-through pattern, Decreased stance time - right, Decreased step length - left, Antalgic Gait velocity: Decreased Gait velocity interpretation: 1.31 - 2.62 ft/sec, indicative of limited community ambulator   General Gait Details: VC's for improved posture, closer walker proximity, and forward gaze. Pt with decreased heel strike and difficulty with step-through gait pattern.   Stairs             Wheelchair Mobility    Modified Rankin (Stroke Patients Only)       Balance Overall balance assessment: No apparent balance deficits (not formally assessed)                                          Cognition Arousal/Alertness: Awake/alert Behavior During Therapy: WFL for tasks assessed/performed Overall Cognitive Status: Within Functional Limits for tasks assessed                                          Exercises Total Joint Exercises Quad Sets: 10 reps Towel Squeeze: 10 reps Short Arc Quad: 10 reps Heel Slides: 10 reps Hip ABduction/ADduction: 10 reps Long Arc Quad: 10 reps    General Comments        Pertinent Vitals/Pain Pain Assessment Pain Assessment: Faces Faces Pain Scale: Hurts little more Pain Location: rt hip Pain Descriptors / Indicators: Grimacing, Operative site guarding, Sore  Pain Intervention(s): Limited activity within patient's tolerance, Monitored during session, Repositioned    Home Living Family/patient expects to be discharged to:: Private residence Living Arrangements: Spouse/significant other Available Help at Discharge: Family;Available 24 hours/day Type of Home: House Home Access: Stairs to enter Entrance Stairs-Rails: None (Garage steps has a wall next to it that she can hold) Entrance Stairs-Number of Steps: 2   Home Layout: Two level;Able to live on main level with  bedroom/bathroom;Laundry or work area in [employer] Equipment: Gilmer Mor - single point;BSC/3in1 (Has borrowed these from a friend)      Prior Function            PT Goals (current goals can now be found in the care plan section) Acute Rehab PT Goals Patient Stated Goal: get back to doing what she wants to PT Goal Formulation: With patient Time For Goal Achievement: 09/08/22 Potential to Achieve Goals: Good Progress towards PT goals: Progressing toward goals    Frequency    7X/week      PT Plan Current plan remains appropriate    Co-evaluation              AM-PAC PT "6 Clicks" Mobility   Outcome Measure  Help needed turning from your back to your side while in a flat bed without using bedrails?: A Little Help needed moving from lying on your back to sitting on the side of a flat bed without using bedrails?: A Little Help needed moving to and from a bed to a chair (including a wheelchair)?: A Little Help needed standing up from a chair using your arms (e.g., wheelchair or bedside chair)?: A Little Help needed to walk in hospital room?: A Little Help needed climbing 3-5 steps with a railing? : A Little 6 Click Score: 18    End of Session Equipment Utilized During Treatment: Gait belt Activity Tolerance: Patient tolerated treatment well Patient left: in chair;with call bell/phone within reach Nurse Communication: Mobility status PT Visit Diagnosis: Other abnormalities of gait and mobility (R26.89);Pain Pain - Right/Left: Right Pain - part of body: Hip     Time: 2130-8657 PT Time Calculation (min) (ACUTE ONLY): 38 min  Charges:  $Gait Training: 23-37 mins $Therapeutic Exercise: 8-22 mins                     Conni Slipper, PT, DPT Acute Rehabilitation Services Secure Chat Preferred Office: [phone]    Marylynn Pearson 09/06/2022, 9:41 AM

## 2022-09-06 NOTE — Discharge Instructions (Signed)
Dental Antibiotics:  In most cases prophylactic antibiotics for Dental procdeures after total joint surgery are not necessary.  Exceptions are as follows:  1. History of prior total joint infection  2. Severely immunocompromised (Organ Transplant, cancer chemotherapy, Rheumatoid biologic meds such as Humera)  3. Poorly controlled diabetes (A1C &gt; 8.0, blood glucose over 200)  If you have one of these conditions, contact your surgeon for an antibiotic prescription, prior to your dental procedure. INSTRUCTIONS AFTER JOINT REPLACEMENT   Remove items at home which could result in a fall. This includes throw rugs or furniture in walking pathways ICE to the affected joint every three hours while awake for 30 minutes at a time, for at least the first 3-5 days, and then as needed for pain and swelling.  Continue to use ice for pain and swelling. You may notice swelling that will progress down to the foot and ankle.  This is normal after surgery.  Elevate your leg when you are not up walking on it.   Continue to use the breathing machine you got in the hospital (incentive spirometer) which will help keep your temperature down.  It is common for your temperature to cycle up and down following surgery, especially at night when you are not up moving around and exerting yourself.  The breathing machine keeps your lungs expanded and your temperature down.   DIET:  As you were doing prior to hospitalization, we recommend a well-balanced diet.  DRESSING / WOUND CARE / SHOWERING  Keep the surgical dressing until follow up.  The dressing is water proof, so you can shower without any extra covering.  IF THE DRESSING FALLS OFF or the wound gets wet inside, change the dressing with sterile gauze.  Please use good hand washing techniques before changing the dressing.  Do not use any lotions or creams on the incision until instructed by your surgeon.    ACTIVITY  Increase activity slowly as tolerated, but  follow the weight bearing instructions below.   No driving for 6 weeks or until further direction given by your physician.  You cannot drive while taking narcotics.  No lifting or carrying greater than 10 lbs. until further directed by your surgeon. Avoid periods of inactivity such as sitting longer than an hour when not asleep. This helps prevent blood clots.  You may return to work once you are authorized by your doctor.     WEIGHT BEARING   Weight bearing as tolerated with assist device (walker, cane, etc) as directed, use it as long as suggested by your surgeon or therapist, typically at least 4-6 weeks.   EXERCISES  Results after joint replacement surgery are often greatly improved when you follow the exercise, range of motion and muscle strengthening exercises prescribed by your doctor. Safety measures are also important to protect the joint from further injury. Any time any of these exercises cause you to have increased pain or swelling, decrease what you are doing until you are comfortable again and then slowly increase them. If you have problems or questions, call your caregiver or physical therapist for advice.   Rehabilitation is important following a joint replacement. After just a few days of immobilization, the muscles of the leg can become weakened and shrink (atrophy).  These exercises are designed to build up the tone and strength of the thigh and leg muscles and to improve motion. Often times heat used for twenty to thirty minutes before working out will loosen up your tissues and help with   improving the range of motion but do not use heat for the first two weeks following surgery (sometimes heat can increase post-operative swelling).   These exercises can be done on a training (exercise) mat, on the floor, on a table or on a bed. Use whatever works the best and is most comfortable for you.    Use music or television while you are exercising so that the exercises are a pleasant  break in your day. This will make your life better with the exercises acting as a break in your routine that you can look forward to.   Perform all exercises about fifteen times, three times per day or as directed.  You should exercise both the operative leg and the other leg as well.  Exercises include:   Quad Sets - Tighten up the muscle on the front of the thigh (Quad) and hold for 5-10 seconds.   Straight Leg Raises - With your knee straight (if you were given a brace, keep it on), lift the leg to 60 degrees, hold for 3 seconds, and slowly lower the leg.  Perform this exercise against resistance later as your leg gets stronger.  Leg Slides: Lying on your back, slowly slide your foot toward your buttocks, bending your knee up off the floor (only go as far as is comfortable). Then slowly slide your foot back down until your leg is flat on the floor again.  Angel Wings: Lying on your back spread your legs to the side as far apart as you can without causing discomfort.  Hamstring Strength:  Lying on your back, push your heel against the floor with your leg straight by tightening up the muscles of your buttocks.  Repeat, but this time bend your knee to a comfortable angle, and push your heel against the floor.  You may put a pillow under the heel to make it more comfortable if necessary.   A rehabilitation program following joint replacement surgery can speed recovery and prevent re-injury in the future due to weakened muscles. Contact your doctor or a physical therapist for more information on knee rehabilitation.    CONSTIPATION  Constipation is defined medically as fewer than three stools per week and severe constipation as less than one stool per week.  Even if you have a regular bowel pattern at home, your normal regimen is likely to be disrupted due to multiple reasons following surgery.  Combination of anesthesia, postoperative narcotics, change in appetite and fluid intake all can affect your  bowels.   YOU MUST use at least one of the following options; they are listed in order of increasing strength to get the job done.  They are all available over the counter, and you may need to use some, POSSIBLY even all of these options:    Drink plenty of fluids (prune juice may be helpful) and high fiber foods Colace 100 mg by mouth twice a day  Senokot for constipation as directed and as needed Dulcolax (bisacodyl), take with full glass of water  Miralax (polyethylene glycol) once or twice a day as needed.  If you have tried all these things and are unable to have a bowel movement in the first 3-4 days after surgery call either your surgeon or your primary doctor.    If you experience loose stools or diarrhea, hold the medications until you stool forms back up.  If your symptoms do not get better within 1 week or if they get worse, check with your doctor.    If you experience "the worst abdominal pain ever" or develop nausea or vomiting, please contact the office immediately for further recommendations for treatment.   ITCHING:  If you experience itching with your medications, try taking only a single pain pill, or even half a pain pill at a time.  You can also use Benadryl over the counter for itching or also to help with sleep.   TED HOSE STOCKINGS:  Use stockings on both legs until for at least 2 weeks or as directed by physician office. They may be removed at night for sleeping.  MEDICATIONS:  See your medication summary on the "After Visit Summary" that nursing will review with you.  You may have some home medications which will be placed on hold until you complete the course of blood thinner medication.  It is important for you to complete the blood thinner medication as prescribed.  PRECAUTIONS:  If you experience chest pain or shortness of breath - call 911 immediately for transfer to the hospital emergency department.   If you develop a fever greater that 101 F, purulent drainage  from wound, increased redness or drainage from wound, foul odor from the wound/dressing, or calf pain - CONTACT YOUR SURGEON.                                                   FOLLOW-UP APPOINTMENTS:  If you do not already have a post-op appointment, please call the office for an appointment to be seen by your surgeon.  Guidelines for how soon to be seen are listed in your "After Visit Summary", but are typically between 1-4 weeks after surgery.  OTHER INSTRUCTIONS:   Knee Replacement:  Do not place pillow under knee, focus on keeping the knee straight while resting. CPM instructions: 0-90 degrees, 2 hours in the morning, 2 hours in the afternoon, and 2 hours in the evening. Place foam block, curve side up under heel at all times except when in CPM or when walking.  DO NOT modify, tear, cut, or change the foam block in any way.  POST-OPERATIVE OPIOID TAPER INSTRUCTIONS: It is important to wean off of your opioid medication as soon as possible. If you do not need pain medication after your surgery it is ok to stop day one. Opioids include: Codeine, Hydrocodone(Norco, Vicodin), Oxycodone(Percocet, oxycontin) and hydromorphone amongst others.  Long term and even short term use of opiods can cause: Increased pain response Dependence Constipation Depression Respiratory depression And more.  Withdrawal symptoms can include Flu like symptoms Nausea, vomiting And more Techniques to manage these symptoms Hydrate well Eat regular healthy meals Stay active Use relaxation techniques(deep breathing, meditating, yoga) Do Not substitute Alcohol to help with tapering If you have been on opioids for less than two weeks and do not have pain than it is ok to stop all together.  Plan to wean off of opioids This plan should start within one week post op of your joint replacement. Maintain the same interval or time between taking each dose and first decrease the dose.  Cut the total daily intake of  opioids by one tablet each day Next start to increase the time between doses. The last dose that should be eliminated is the evening dose.   MAKE SURE YOU:  Understand these instructions.  Get help right away if you are not doing   well or get worse.    Thank you for letting us be a part of your medical care team.  It is a privilege we respect greatly.  We hope these instructions will help you stay on track for a fast and full recovery!       

## 2022-09-07 ENCOUNTER — Telehealth: Payer: Self-pay | Admitting: *Deleted

## 2022-09-07 ENCOUNTER — Telehealth: Payer: Self-pay

## 2022-09-07 NOTE — Telephone Encounter (Signed)
Ortho bundle D/C call completed. 

## 2022-09-07 NOTE — Transitions of Care (Post Inpatient/ED Visit) (Signed)
   09/07/2022  Name: Amy Dennis MRN: [id number] DOB: 10/21/1950  Today's TOC FU Call Status: Today's TOC FU Call Status:: Successful TOC FU Call Competed TOC FU Call Complete Date: 09/07/22  Transition Care Management Follow-up Telephone Call Date of Discharge: 09/06/22 Discharge Facility: [facility]) Type of Discharge: Inpatient Admission Primary Inpatient Discharge Diagnosis:: osteoarthirtis right hip How have you been since you were released from the hospital?: Better Any questions or concerns?: No  Items Reviewed: Did you receive and understand the discharge instructions provided?: Yes Medications obtained and verified?: Yes (Medications Reviewed) Any new allergies since your discharge?: No Dietary orders reviewed?: Yes Do you have support at home?: Yes People in Home: friend(s)  Home Care and Equipment/Supplies: Were Home Health Services Ordered?: Yes Name of Home Health Agency:: [facility] Has Agency set up a time to come to your home?: No Any new equipment or medical supplies ordered?: Yes Were you able to get the equipment/medical supplies?: Yes Do you have any questions related to the use of the equipment/supplies?: No  Functional Questionnaire: Do you need assistance with bathing/showering or dressing?: No Do you need assistance with meal preparation?: No Do you need assistance with eating?: No Do you have difficulty maintaining continence: No Do you need assistance with getting out of bed/getting out of a chair/moving?: No Do you have difficulty managing or taking your medications?: No  Follow up appointments reviewed: PCP Follow-up appointment confirmed?: NA Specialist Hospital Follow-up appointment confirmed?: Yes Date of Specialist follow-up appointment?: 09/18/22 Follow-Up Specialty Provider:: Dr [name] Do you need transportation to your follow-up appointment?: No Do you understand care options if your condition(s) worsen?: Yes-patient verbalized  understanding    SIGNATURE Karena Addison, LPN [facility] Nurse Health Advisor Direct Dial [phone]

## 2022-09-12 ENCOUNTER — Telehealth: Payer: Self-pay | Admitting: *Deleted

## 2022-09-12 NOTE — Telephone Encounter (Signed)
1 week call completed; spoke with patient who is doing very well. She has stopped taking pain medication and is using Tylenol with her muscle relaxer only. She is taking her daily aspirin and does have some bruising she has noted. Answered questions related to mobility, swelling and compression. Appt with [place] next week for post op. Reminded to call with questions.

## 2022-09-13 DIAGNOSIS — H5203 Hypermetropia, bilateral: Secondary | ICD-10-CM | POA: Diagnosis not present

## 2022-09-18 ENCOUNTER — Ambulatory Visit (INDEPENDENT_AMBULATORY_CARE_PROVIDER_SITE_OTHER): Payer: Medicare PPO | Admitting: Orthopaedic Surgery

## 2022-09-18 ENCOUNTER — Encounter: Payer: Self-pay | Admitting: Orthopaedic Surgery

## 2022-09-18 DIAGNOSIS — Z96641 Presence of right artificial hip joint: Secondary | ICD-10-CM

## 2022-09-18 NOTE — Progress Notes (Signed)
The patient is a 73 year old female who was here for her first postoperative visit status post a right total hip arthroplasty.  She is taking a baby aspirin twice a day and just Robaxin.  She is already driven as well.  She is using her cane minimally.  On exam her right hip shows the staples been removed and Steri-Strips applied.  There is not a significant seroma.  She tolerates me putting her through range of motion.  At this point she can stop her aspirin twice daily.  She will continue to increase her activities as comfort allows.  She knows to wait at least 2-3 more weeks before submerging underwater in her pool.  We will see her back in 4 weeks to see how she is doing overall but no x-rays are needed.

## 2022-10-19 ENCOUNTER — Encounter: Payer: Self-pay | Admitting: Orthopaedic Surgery

## 2022-10-19 ENCOUNTER — Ambulatory Visit (INDEPENDENT_AMBULATORY_CARE_PROVIDER_SITE_OTHER): Payer: Medicare PPO | Admitting: Orthopaedic Surgery

## 2022-10-19 DIAGNOSIS — Z96641 Presence of right artificial hip joint: Secondary | ICD-10-CM

## 2022-10-19 NOTE — Progress Notes (Signed)
The patient is now 6 weeks status post a right total hip arthroplasty.  She says she is doing great.  She is an active 72 year old female.  She reports good range of motion and strength.  She is not walking with an assistive device and no significant limp.  She is very to golf and play pickle ball as well.  Overall things look really good.  She tolerates me putting her hip through range of motion without any difficulty at all.  Will see her back in 6 months to see how she is doing overall and if there are any issues.  Will have a standing AP pelvis and lateral of her right hip at that visit.

## 2022-11-02 ENCOUNTER — Other Ambulatory Visit: Payer: Self-pay | Admitting: Family Medicine

## 2022-11-02 NOTE — Telephone Encounter (Signed)
Requested Prescriptions  Pending Prescriptions Disp Refills   hydrochlorothiazide (HYDRODIURIL) 25 MG tablet [Pharmacy Med Name: hydroCHLOROthiazide 25 MG Oral Tablet] 90 tablet 0    Sig: Take 1 tablet by mouth once daily     Cardiovascular: Diuretics - Thiazide Failed - 11/02/2022  8:39 AM      Failed - Na in normal range and within 180 days    Sodium  Date Value Ref Range Status  09/06/2022 128 (L) 135 - 145 mmol/L Final         Failed - Valid encounter within last 6 months    Recent Outpatient Visits           1 year ago LLQ abdominal pain   [facility] [name], [name], MD   1 year ago Benign essential HTN   [facility] Tanya Nones, [name], MD   2 years ago Routine general medical examination at a health care facility   [facility] [name], [name], MD   3 years ago Routine general medical examination at a health care facility   [facility] Donita Brooks, MD   4 years ago Benign essential HTN   [facility] Donita Brooks, MD       Future Appointments             In 5 months Kathryne Hitch, MD [facility]            Passed - Cr in normal range and within 180 days    Creat  Date Value Ref Range Status  04/25/2022 0.65 0.60 - 1.00 mg/dL Final   Creatinine, Ser  Date Value Ref Range Status  09/06/2022 0.74 0.44 - 1.00 mg/dL Final         Passed - K in normal range and within 180 days    Potassium  Date Value Ref Range Status  09/06/2022 3.5 3.5 - 5.1 mmol/L Final         Passed - Last BP in normal range    BP Readings from Last 1 Encounters:  09/06/22 111/63

## 2022-11-28 ENCOUNTER — Other Ambulatory Visit: Payer: Self-pay | Admitting: Family Medicine

## 2022-12-04 ENCOUNTER — Other Ambulatory Visit: Payer: Self-pay | Admitting: Family Medicine

## 2023-01-09 ENCOUNTER — Telehealth: Payer: Self-pay | Admitting: Orthopaedic Surgery

## 2023-01-09 ENCOUNTER — Encounter (HOSPITAL_BASED_OUTPATIENT_CLINIC_OR_DEPARTMENT_OTHER): Payer: Self-pay

## 2023-01-09 NOTE — Telephone Encounter (Signed)
Pt called stating she had hi replacement 4 months ago and pt has an upcoming dental and if she need pre meds. Please send pre meds to [facility] on [address] if need. Please call pt about this matter at [phone].

## 2023-01-09 NOTE — Telephone Encounter (Signed)
Pt called requesting we fax a letter to Dr. Silvio Clayman Dental office pt don't require pre meds for tomorrow visit. Please fax to [phone].Marland Kitchen Pt phone number is [phone]

## 2023-01-09 NOTE — Telephone Encounter (Signed)
Note completed and faxed. Lvm advising pt it was faxed

## 2023-01-09 NOTE — Telephone Encounter (Signed)
Lvm for pt advising this is no longer needed per Dr. [name] protocol

## 2023-01-26 ENCOUNTER — Other Ambulatory Visit: Payer: Self-pay | Admitting: Family Medicine

## 2023-01-29 NOTE — Telephone Encounter (Signed)
Requested Prescriptions  Pending Prescriptions Disp Refills   hydrochlorothiazide (HYDRODIURIL) 25 MG tablet [Pharmacy Med Name: hydroCHLOROthiazide 25 MG Oral Tablet] 90 tablet 0    Sig: Take 1 tablet by mouth once daily     Cardiovascular: Diuretics - Thiazide Failed - 01/26/2023  9:43 PM      Failed - Na in normal range and within 180 days    Sodium  Date Value Ref Range Status  09/06/2022 128 (L) 135 - 145 mmol/L Final         Failed - Valid encounter within last 6 months    Recent Outpatient Visits           1 year ago LLQ abdominal pain   [facility] Donita Brooks, MD   2 years ago Benign essential HTN   [facility] Tanya Nones, [name], MD   2 years ago Routine general medical examination at a health care facility   [facility] [name], [name], MD   3 years ago Routine general medical examination at a health care facility   [facility] [name], [name], MD   4 years ago Benign essential HTN   [facility] Donita Brooks, MD       Future Appointments             In 2 months Kathryne Hitch, MD [facility]            Passed - Cr in normal range and within 180 days    Creat  Date Value Ref Range Status  04/25/2022 0.65 0.60 - 1.00 mg/dL Final   Creatinine, Ser  Date Value Ref Range Status  09/06/2022 0.74 0.44 - 1.00 mg/dL Final         Passed - K in normal range and within 180 days    Potassium  Date Value Ref Range Status  09/06/2022 3.5 3.5 - 5.1 mmol/L Final         Passed - Last BP in normal range    BP Readings from Last 1 Encounters:  09/06/22 111/63

## 2023-02-24 ENCOUNTER — Other Ambulatory Visit: Payer: Self-pay | Admitting: Family Medicine

## 2023-02-26 NOTE — Telephone Encounter (Signed)
Patient will need an office visit for further refills. Requested Prescriptions  Pending Prescriptions Disp Refills   simvastatin (ZOCOR) 40 MG tablet [Pharmacy Med Name: Simvastatin 40 MG Oral Tablet] 90 tablet 0    Sig: TAKE 1 TABLET BY MOUTH AT BEDTIME     Cardiovascular:  Antilipid - Statins Failed - 02/24/2023  6:51 AM      Failed - Valid encounter within last 12 months    Recent Outpatient Visits           1 year ago LLQ abdominal pain   [facility] Donita Brooks, MD   2 years ago Benign essential HTN   [facility] Tanya Nones, [name], MD   2 years ago Routine general medical examination at a health care facility   [facility] [name], [name], MD   4 years ago Routine general medical examination at a health care facility   [facility] [name], [name], MD   4 years ago Benign essential HTN   [facility] [name], [name], MD       Future Appointments             In 1 month Kathryne Hitch, MD [facility]            Failed - Lipid Panel in normal range within the last 12 months    Cholesterol  Date Value Ref Range Status  04/25/2022 155 <200 mg/dL Final   LDL Cholesterol (Calc)  Date Value Ref Range Status  04/25/2022 68 mg/dL (calc) Final    Comment:    Reference range: <100 . Desirable range <100 mg/dL for primary prevention;   <70 mg/dL for patients with CHD or diabetic patients  with > or = 2 CHD risk factors. Marland Kitchen LDL-C is now calculated using the Martin-Hopkins  calculation, which is a validated novel method providing  better accuracy than the Friedewald equation in the  estimation of LDL-C.  Horald Pollen et al. Lenox Ahr. 0865;784(69): 2061-2068  (http://education.[facility].com/faq/FAQ164)    HDL  Date Value Ref Range Status  04/25/2022 73 > OR = 50 mg/dL Final   Triglycerides  Date Value Ref Range Status  04/25/2022 62 <150 mg/dL  Final         Passed - Patient is not pregnant

## 2023-03-14 ENCOUNTER — Ambulatory Visit (INDEPENDENT_AMBULATORY_CARE_PROVIDER_SITE_OTHER): Payer: Medicare PPO

## 2023-03-14 DIAGNOSIS — Z23 Encounter for immunization: Secondary | ICD-10-CM | POA: Diagnosis not present

## 2023-03-14 NOTE — Progress Notes (Signed)
Pt came in for high dose flu vaccine. Injection given to pt R-upper arm. Pt tol injection well w/no c/o. Pt left amublatory w/no c/o

## 2023-03-22 DIAGNOSIS — D0462 Carcinoma in situ of skin of left upper limb, including shoulder: Secondary | ICD-10-CM | POA: Diagnosis not present

## 2023-03-22 DIAGNOSIS — L821 Other seborrheic keratosis: Secondary | ICD-10-CM | POA: Diagnosis not present

## 2023-03-22 DIAGNOSIS — D0472 Carcinoma in situ of skin of left lower limb, including hip: Secondary | ICD-10-CM | POA: Diagnosis not present

## 2023-03-22 DIAGNOSIS — Z85828 Personal history of other malignant neoplasm of skin: Secondary | ICD-10-CM | POA: Diagnosis not present

## 2023-03-22 DIAGNOSIS — L814 Other melanin hyperpigmentation: Secondary | ICD-10-CM | POA: Diagnosis not present

## 2023-03-22 DIAGNOSIS — L570 Actinic keratosis: Secondary | ICD-10-CM | POA: Diagnosis not present

## 2023-04-06 ENCOUNTER — Other Ambulatory Visit: Payer: Self-pay | Admitting: Family Medicine

## 2023-04-06 NOTE — Telephone Encounter (Signed)
Requested Prescriptions  Pending Prescriptions Disp Refills   fluticasone (FLONASE) 50 MCG/ACT nasal spray [Pharmacy Med Name: Fluticasone Propionate 50 MCG/ACT Nasal Suspension] 48 g 0    Sig: Use 2 spray(s) in each nostril once daily     Ear, Nose, and Throat: Nasal Preparations - Corticosteroids Failed - 04/06/2023  3:42 PM      Failed - Valid encounter within last 12 months    Recent Outpatient Visits           1 year ago LLQ abdominal pain   [facility] Donita Brooks, MD   2 years ago Benign essential HTN   [facility] Tanya Nones, [name], MD   3 years ago Routine general medical examination at a health care facility   [facility] [name], [name], MD   4 years ago Routine general medical examination at a health care facility   [facility] [name], [name], MD   4 years ago Benign essential HTN   [facility] [name], [name], MD       Future Appointments             In 2 weeks [name] [name], MD [facility]   In 4 weeks [name], [name], MD [facility]

## 2023-04-23 ENCOUNTER — Other Ambulatory Visit: Payer: Medicare PPO

## 2023-04-24 ENCOUNTER — Other Ambulatory Visit: Payer: Self-pay | Admitting: Family Medicine

## 2023-04-25 ENCOUNTER — Encounter: Payer: Self-pay | Admitting: Orthopaedic Surgery

## 2023-04-25 ENCOUNTER — Ambulatory Visit: Payer: Medicare PPO | Admitting: Orthopaedic Surgery

## 2023-04-25 ENCOUNTER — Other Ambulatory Visit (INDEPENDENT_AMBULATORY_CARE_PROVIDER_SITE_OTHER): Payer: Medicare PPO

## 2023-04-25 DIAGNOSIS — Z96641 Presence of right artificial hip joint: Secondary | ICD-10-CM

## 2023-04-25 NOTE — Progress Notes (Signed)
The patient is here today in follow-up just over 7 months status post a right total hip arthroplasty to treat severe right hip arthritis.  She reports that she is doing well and has no issues as a relates to her right hip other than occasional pain on the lateral aspect of her right hip.  She denies any left hip pain.  She is walking without an assistive device.  She has good posture and her leg lengths appear normal.  She walks without a limp.  Her right hip moves smoothly and fluidly and is the same as her left hip to move smoothly and fluidly.  Standing AP pelvis and lateral of the right hip shows a well-seated right total hip arthroplasty with no complicating features.  The left hip which is a native hip appears well-maintained.  At this point follow-up for her right hip replacement can be as needed.  If she does develop any issues at all she knows to let us know.

## 2023-05-01 ENCOUNTER — Other Ambulatory Visit: Payer: Medicare PPO

## 2023-05-01 DIAGNOSIS — I10 Essential (primary) hypertension: Secondary | ICD-10-CM

## 2023-05-01 DIAGNOSIS — R011 Cardiac murmur, unspecified: Secondary | ICD-10-CM | POA: Diagnosis not present

## 2023-05-01 DIAGNOSIS — E7800 Pure hypercholesterolemia, unspecified: Secondary | ICD-10-CM | POA: Diagnosis not present

## 2023-05-01 DIAGNOSIS — I700 Atherosclerosis of aorta: Secondary | ICD-10-CM

## 2023-05-02 LAB — CBC WITH DIFFERENTIAL/PLATELET
Absolute Lymphocytes: 1214 {cells}/uL (ref 850–3900)
Absolute Monocytes: 413 {cells}/uL (ref 200–950)
Basophils Absolute: 41 {cells}/uL (ref 0–200)
Basophils Relative: 0.8 %
Eosinophils Absolute: 219 {cells}/uL (ref 15–500)
Eosinophils Relative: 4.3 %
HCT: 37.5 % (ref 35.0–45.0)
Hemoglobin: 12.2 g/dL (ref 11.7–15.5)
MCH: 28.1 pg (ref 27.0–33.0)
MCHC: 32.5 g/dL (ref 32.0–36.0)
MCV: 86.4 fL (ref 80.0–100.0)
MPV: 12.8 fL — ABNORMAL HIGH (ref 7.5–12.5)
Monocytes Relative: 8.1 %
Neutro Abs: 3213 {cells}/uL (ref 1500–7800)
Neutrophils Relative %: 63 %
Platelets: 190 10*3/uL (ref 140–400)
RBC: 4.34 10*6/uL (ref 3.80–5.10)
RDW: 13.1 % (ref 11.0–15.0)
Total Lymphocyte: 23.8 %
WBC: 5.1 10*3/uL (ref 3.8–10.8)

## 2023-05-02 LAB — COMPLETE METABOLIC PANEL WITH GFR
AG Ratio: 1.6 (calc) (ref 1.0–2.5)
ALT: 16 U/L (ref 6–29)
AST: 17 U/L (ref 10–35)
Albumin: 4.1 g/dL (ref 3.6–5.1)
Alkaline phosphatase (APISO): 64 U/L (ref 37–153)
BUN: 13 mg/dL (ref 7–25)
CO2: 30 mmol/L (ref 20–32)
Calcium: 9 mg/dL (ref 8.6–10.4)
Chloride: 103 mmol/L (ref 98–110)
Creat: 0.81 mg/dL (ref 0.60–1.00)
Globulin: 2.6 g/dL (ref 1.9–3.7)
Glucose, Bld: 104 mg/dL — ABNORMAL HIGH (ref 65–99)
Potassium: 4.6 mmol/L (ref 3.5–5.3)
Sodium: 140 mmol/L (ref 135–146)
Total Bilirubin: 0.3 mg/dL (ref 0.2–1.2)
Total Protein: 6.7 g/dL (ref 6.1–8.1)
eGFR: 77 mL/min/{1.73_m2} (ref >=60)

## 2023-05-02 LAB — LIPID PANEL
Cholesterol: 241 mg/dL — ABNORMAL HIGH (ref <200)
HDL: 66 mg/dL (ref >=50)
LDL Cholesterol (Calc): 151 mg/dL — ABNORMAL HIGH
Non-HDL Cholesterol (Calc): 175 mg/dL — ABNORMAL HIGH (ref <130)
Total CHOL/HDL Ratio: 3.7 (calc) (ref <5.0)
Triglycerides: 120 mg/dL (ref <150)

## 2023-05-04 ENCOUNTER — Encounter: Payer: Self-pay | Admitting: Family Medicine

## 2023-05-04 ENCOUNTER — Ambulatory Visit: Payer: Medicare PPO | Admitting: Family Medicine

## 2023-05-04 VITALS — BP 124/76 | HR 85 | Temp 98.1°F | Ht 64.0 in | Wt 152.5 lb

## 2023-05-04 DIAGNOSIS — I10 Essential (primary) hypertension: Secondary | ICD-10-CM

## 2023-05-04 DIAGNOSIS — Z780 Asymptomatic menopausal state: Secondary | ICD-10-CM | POA: Diagnosis not present

## 2023-05-04 DIAGNOSIS — E7800 Pure hypercholesterolemia, unspecified: Secondary | ICD-10-CM

## 2023-05-04 DIAGNOSIS — Z0001 Encounter for general adult medical examination with abnormal findings: Secondary | ICD-10-CM

## 2023-05-04 DIAGNOSIS — Z0000 Encounter for general adult medical examination without abnormal findings: Secondary | ICD-10-CM

## 2023-05-04 MED ORDER — ALPRAZOLAM 0.5 MG PO TABS: 0.5000 mg | ORAL_TABLET | Freq: Every evening | ORAL | 0 refills | Status: DC | PRN

## 2023-05-04 MED ORDER — ATORVASTATIN CALCIUM 40 MG PO TABS: 40.0000 mg | ORAL_TABLET | Freq: Every day | ORAL | 3 refills | Status: DC

## 2023-05-04 NOTE — Progress Notes (Signed)
Subjective:    Patient ID: Amy Dennis, female    DOB: 25-Oct-1950, 72 y.o.   MRN: [id number] Patient is here today for complete physical exam.  Her last mammogram was January 2024.  This is due again in January 2025.  Her last colonoscopy was 2019 and this is up-to-date.  She is not due for a Pap smear due to age.  She denies any memory loss.  She denies any falls.  She denies any depression.  She does report trouble sleeping.  Occasionally she will toss and turn unable to fall asleep.  She would like something that she could take sparingly as needed to help her go to sleep.  Her immunizations are up-to-date.  She is due for shingles vaccine.  She is overdue for a bone density test.  Most recent lab work is listed below Lab on 05/01/2023  Component Date Value Ref Range Status   WBC 05/01/2023 5.1  3.8 - 10.8 Thousand/uL Final   RBC 05/01/2023 4.34  3.80 - 5.10 Million/uL Final   Hemoglobin 05/01/2023 12.2  11.7 - 15.5 g/dL Final   HCT 40/98/1191 37.5  35.0 - 45.0 % Final   MCV 05/01/2023 86.4  80.0 - 100.0 fL Final   MCH 05/01/2023 28.1  27.0 - 33.0 pg Final   MCHC 05/01/2023 32.5  32.0 - 36.0 g/dL Final   Comment: For adults, a slight decrease in the calculated MCHC value (in the range of 30 to 32 g/dL) is most likely not clinically significant; however, it should be interpreted with caution in correlation with other red cell parameters and the patient's clinical condition.    RDW 05/01/2023 13.1  11.0 - 15.0 % Final   Platelets 05/01/2023 190  140 - 400 Thousand/uL Final   MPV 05/01/2023 12.8 (H)  7.5 - 12.5 fL Final   Neutro Abs 05/01/2023 3,213  1,500 - 7,800 cells/uL Final   Absolute Lymphocytes 05/01/2023 1,214  850 - 3,900 cells/uL Final   Absolute Monocytes 05/01/2023 413  200 - 950 cells/uL Final   Eosinophils Absolute 05/01/2023 219  15 - 500 cells/uL Final   Basophils Absolute 05/01/2023 41  0 - 200 cells/uL Final   Neutrophils Relative % 05/01/2023 63  % Final   Total  Lymphocyte 05/01/2023 23.8  % Final   Monocytes Relative 05/01/2023 8.1  % Final   Eosinophils Relative 05/01/2023 4.3  % Final   Basophils Relative 05/01/2023 0.8  % Final   Glucose, Bld 05/01/2023 104 (H)  65 - 99 mg/dL Final   Comment: .            Fasting reference interval . For someone without known diabetes, a glucose value between 100 and 125 mg/dL is consistent with prediabetes and should be confirmed with a follow-up test. .    BUN 05/01/2023 13  7 - 25 mg/dL Final   Creat 47/82/9562 0.81  0.60 - 1.00 mg/dL Final   eGFR 13/12/6576 77  > OR = 60 mL/min/1.47m2 Final   BUN/Creatinine Ratio 05/01/2023 SEE NOTE:  6 - 22 (calc) Final   Comment:    Not Reported: BUN and Creatinine are within    reference range. .    Sodium 05/01/2023 140  135 - 146 mmol/L Final   Potassium 05/01/2023 4.6  3.5 - 5.3 mmol/L Final   Chloride 05/01/2023 103  98 - 110 mmol/L Final   CO2 05/01/2023 30  20 - 32 mmol/L Final   Calcium 05/01/2023 9.0  8.6 -  10.4 mg/dL Final   Total Protein 16/02/9603 6.7  6.1 - 8.1 g/dL Final   Albumin 54/01/8118 4.1  3.6 - 5.1 g/dL Final   Globulin 14/78/2956 2.6  1.9 - 3.7 g/dL (calc) Final   AG Ratio 05/01/2023 1.6  1.0 - 2.5 (calc) Final   Total Bilirubin 05/01/2023 0.3  0.2 - 1.2 mg/dL Final   Alkaline phosphatase (APISO) 05/01/2023 64  37 - 153 U/L Final   AST 05/01/2023 17  10 - 35 U/L Final   ALT 05/01/2023 16  6 - 29 U/L Final   Cholesterol 05/01/2023 241 (H)  <200 mg/dL Final   HDL 21/30/8657 66  > OR = 50 mg/dL Final   Triglycerides 84/69/6295 120  <150 mg/dL Final   LDL Cholesterol (Calc) 05/01/2023 151 (H)  mg/dL (calc) Final   Comment: Reference range: <100 . Desirable range <100 mg/dL for primary prevention;   <70 mg/dL for patients with CHD or diabetic patients  with > or = 2 CHD risk factors. Marland Kitchen LDL-C is now calculated using the Martin-Hopkins  calculation, which is a validated novel method providing  better accuracy than the Friedewald  equation in the  estimation of LDL-C.  Horald Pollen et al. Lenox Ahr. 2841;324(40): 2061-2068  (http://education.[facility].com/faq/FAQ164)    Total CHOL/HDL Ratio 05/01/2023 3.7  <1.0 (calc) Final   Non-HDL Cholesterol (Calc) 05/01/2023 175 (H)  <130 mg/dL (calc) Final   Comment: For patients with diabetes plus 1 major ASCVD risk  factor, treating to a non-HDL-C goal of <100 mg/dL  (LDL-C of <27 mg/dL) is considered a therapeutic  option.     Past Medical History:  Diagnosis Date   Allergy    Arthritis    Phreesia 03/17/2020   Heart murmur    Hyperlipidemia    Hypertension    Past Surgical History:  Procedure Laterality Date   CESAREAN SECTION     x 2   TOTAL HIP ARTHROPLASTY Right 09/05/2022   Procedure: RIGHT TOTAL HIP ARTHROPLASTY ANTERIOR APPROACH;  Surgeon: Kathryne Hitch, MD;  Location: [facility] OR;  Service: Orthopedics;  Laterality: Right;   WISDOM TOOTH EXTRACTION Bilateral    Current Outpatient Medications on File Prior to Visit  Medication Sig Dispense Refill   CALCIUM-VITAMIN D PO Take 1 tablet by mouth in the morning.     carboxymethylcellulose (REFRESH PLUS) 0.5 % SOLN Place 1 drop into both eyes daily as needed (dry/irritated eyes.).     cholecalciferol 25 MCG (1000 UT) tablet Take 1,000 Units by mouth in the morning.     cyanocobalamin (VITAMIN B12) 500 MCG tablet Take 1,000 mcg by mouth daily.     fluticasone (FLONASE) 50 MCG/ACT nasal spray Use 2 spray(s) in each nostril once daily 48 g 0   hydrochlorothiazide (HYDRODIURIL) 25 MG tablet Take 1 tablet by mouth once daily 90 tablet 0   Melatonin 10 MG TABS Take 10 mg by mouth at bedtime as needed (for sleep).     aspirin 81 MG chewable tablet Chew 1 tablet (81 mg total) by mouth 2 (two) times daily. (Patient not taking: Reported on 05/04/2023) 35 tablet 0   Inulin (FIBER CHOICE PREBIOTIC FIBER) 1.5 g CHEW Chew 1.5 g by mouth in the morning. (Patient not taking: Reported on 05/04/2023)     lidocaine (SALONPAS  PAIN RELIEVING) 4 % Place 1 patch onto the skin daily as needed (PAIN.).     loratadine (CLARITIN) 10 MG tablet Take 10 mg by mouth daily.     Menthol, Topical  Analgesic, (BIOFREEZE EX) Apply 1 Application topically 3 (three) times daily as needed (pain.). (Patient not taking: Reported on 05/04/2023)     methocarbamol (ROBAXIN) 500 MG tablet Take 1 tablet (500 mg total) by mouth every 6 (six) hours as needed for muscle spasms. (Patient not taking: Reported on 05/04/2023) 40 tablet 1   oxyCODONE (OXY IR/ROXICODONE) 5 MG immediate release tablet Take 1-2 tablets (5-10 mg total) by mouth every 6 (six) hours as needed for moderate pain (pain score 4-6). 30 tablet 0   No current facility-administered medications on file prior to visit.   Allergies  Allergen Reactions   Mobic [Meloxicam] Diarrhea   Social History   Socioeconomic History   Marital status: Married    Spouse name: Tresa Endo   Number of children: 2   Years of education: Not on file   Highest education level: Not on file  Occupational History   Not on file  Tobacco Use   Smoking status: Never   Smokeless tobacco: Never  Vaping Use   Vaping status: Never Used  Substance and Sexual Activity   Alcohol use: No   Drug use: No   Sexual activity: Not Currently  Other Topics Concern   Not on file  Social History Narrative   Married x 48 years in 2022.   1 grandchild age 43   Social Drivers of Health   Financial Resource Strain: Low Risk  (04/14/2022)   Overall Financial Resource Strain (CARDIA)    Difficulty of Paying Living Expenses: Not hard at all  Food Insecurity: No Food Insecurity (04/14/2022)   Hunger Vital Sign    Worried About Running Out of Food in the Last Year: Never true    Ran Out of Food in the Last Year: Never true  Transportation Needs: No Transportation Needs (04/01/2021)   PRAPARE - Administrator, Civil Service (Medical): No    Lack of Transportation (Non-Medical): No  Physical Activity:  Sufficiently Active (04/14/2022)   Exercise Vital Sign    Days of Exercise per Week: 5 days    Minutes of Exercise per Session: 30 min  Stress: No Stress Concern Present (04/14/2022)   [employer] of Occupational Health - Occupational Stress Questionnaire    Feeling of Stress : Only a little  Social Connections: Socially Integrated (04/14/2022)   Social Connection and Isolation Panel [NHANES]    Frequency of Communication with Friends and Family: More than three times a week    Frequency of Social Gatherings with Friends and Family: More than three times a week    Attends Religious Services: 1 to 4 times per year    Active Member of [employer] or Organizations: Yes    Attends Banker Meetings: 1 to 4 times per year    Marital Status: Married  Catering manager Violence: Not At Risk (04/14/2022)   Humiliation, Afraid, Rape, and Kick questionnaire    Fear of Current or Ex-Partner: No    Emotionally Abused: No    Physically Abused: No    Sexually Abused: No   Family History  Problem Relation Age of Onset   Heart disease Mother    Hypertension Mother    Arthritis Mother    Heart disease Father       Review of Systems  All other systems reviewed and are negative.      Objective:   Physical Exam Vitals reviewed.  Constitutional:      General: She is not in acute distress.  Appearance: Normal appearance. She is well-developed. She is not ill-appearing, toxic-appearing or diaphoretic.  HENT:     Head: Normocephalic and atraumatic.     Right Ear: Tympanic membrane, ear canal and external ear normal.     Left Ear: Tympanic membrane, ear canal and external ear normal.     Nose: Congestion and rhinorrhea present.     Mouth/Throat:     Pharynx: No oropharyngeal exudate or posterior oropharyngeal erythema.  Eyes:     General: No scleral icterus.       Right eye: No discharge.        Left eye: No discharge.     Conjunctiva/sclera: Conjunctivae normal.     Pupils:  Pupils are equal, round, and reactive to light.  Neck:     Thyroid: No thyromegaly.     Vascular: No JVD.     Trachea: No tracheal deviation.  Cardiovascular:     Rate and Rhythm: Normal rate and regular rhythm.     Heart sounds: Normal heart sounds. No murmur heard.    No friction rub. No gallop.  Pulmonary:     Effort: Pulmonary effort is normal. No respiratory distress.     Breath sounds: Normal breath sounds. No stridor. No wheezing or rales.  Chest:     Chest wall: No tenderness.  Abdominal:     General: Abdomen is flat. There is no distension.     Palpations: Abdomen is soft.     Tenderness: There is no guarding or rebound.  Musculoskeletal:     Cervical back: Normal range of motion and neck supple.     Right hip: Tenderness present. Decreased range of motion.  Lymphadenopathy:     Cervical: No cervical adenopathy.  Skin:    General: Skin is warm.     Findings: No rash.  Neurological:     Mental Status: She is alert and oriented to person, place, and time.     Cranial Nerves: No cranial nerve deficit.     Motor: No abnormal muscle tone.     Coordination: Coordination normal.     Deep Tendon Reflexes: Reflexes are normal and symmetric.           Assessment & Plan:  Postmenopausal estrogen deficiency - Plan: DG Bone Density  Benign essential HTN  Pure hypercholesterolemia  General medical exam Blood pressure today is excellent however cholesterol has worsened significantly despite the fact the patient has not changed her simvastatin at all.  Therefore I recommended that we switch simvastatin to Lipitor 40 mg a day and recheck cholesterol in 3 months.  Also recommended low carbohydrate diet.  Colonoscopy and mammogram are up-to-date.  I will schedule the patient for a bone density test.  I also recommended the shingles vaccine but the patient defers that today

## 2023-06-07 ENCOUNTER — Encounter: Payer: Self-pay | Admitting: Family Medicine

## 2023-06-07 ENCOUNTER — Ambulatory Visit: Payer: Medicare PPO | Admitting: Family Medicine

## 2023-06-07 VITALS — BP 124/76 | HR 78 | Temp 99.3°F | Ht 64.0 in | Wt 154.0 lb

## 2023-06-07 DIAGNOSIS — J329 Chronic sinusitis, unspecified: Secondary | ICD-10-CM

## 2023-06-07 MED ORDER — AMOXICILLIN 875 MG PO TABS: 875.0000 mg | ORAL_TABLET | Freq: Two times a day (BID) | ORAL | 0 refills | Status: AC

## 2023-06-07 NOTE — Progress Notes (Signed)
Subjective:    Patient ID: Amy Dennis, female    DOB: June 22, 1950, 73 y.o.   MRN: [id number] Patient has had sinus pain above her right eye for over a week.  The pain is getting worse.  Now she has a constant headache and a low-grade fever.  She also reports postnasal drip and drainage.  She has a nonproductive cough.  She denies any body aches or shortness of breath or fever Past Medical History:  Diagnosis Date   Allergy    Arthritis    Phreesia 03/17/2020   Heart murmur    Hyperlipidemia    Hypertension    Past Surgical History:  Procedure Laterality Date   CESAREAN SECTION     x 2   TOTAL HIP ARTHROPLASTY Right 09/05/2022   Procedure: RIGHT TOTAL HIP ARTHROPLASTY ANTERIOR APPROACH;  Surgeon: Kathryne Hitch, MD;  Location: [facility] OR;  Service: Orthopedics;  Laterality: Right;   WISDOM TOOTH EXTRACTION Bilateral    Current Outpatient Medications on File Prior to Visit  Medication Sig Dispense Refill   ALPRAZolam (XANAX) 0.5 MG tablet Take 1 tablet (0.5 mg total) by mouth at bedtime as needed for sleep. 30 tablet 0   aspirin 81 MG chewable tablet Chew 1 tablet (81 mg total) by mouth 2 (two) times daily. (Patient not taking: Reported on 05/04/2023) 35 tablet 0   atorvastatin (LIPITOR) 40 MG tablet Take 1 tablet (40 mg total) by mouth daily. 90 tablet 3   CALCIUM-VITAMIN D PO Take 1 tablet by mouth in the morning.     carboxymethylcellulose (REFRESH PLUS) 0.5 % SOLN Place 1 drop into both eyes daily as needed (dry/irritated eyes.).     cholecalciferol 25 MCG (1000 UT) tablet Take 1,000 Units by mouth in the morning.     cyanocobalamin (VITAMIN B12) 500 MCG tablet Take 1,000 mcg by mouth daily.     fluticasone (FLONASE) 50 MCG/ACT nasal spray Use 2 spray(s) in each nostril once daily 48 g 0   hydrochlorothiazide (HYDRODIURIL) 25 MG tablet Take 1 tablet by mouth once daily 90 tablet 0   Inulin (FIBER CHOICE PREBIOTIC FIBER) 1.5 g CHEW Chew 1.5 g by mouth in the morning.  (Patient not taking: Reported on 05/04/2023)     loratadine (CLARITIN) 10 MG tablet Take 10 mg by mouth daily.     Melatonin 10 MG TABS Take 10 mg by mouth at bedtime as needed (for sleep).     Menthol, Topical Analgesic, (BIOFREEZE EX) Apply 1 Application topically 3 (three) times daily as needed (pain.). (Patient not taking: Reported on 05/04/2023)     methocarbamol (ROBAXIN) 500 MG tablet Take 1 tablet (500 mg total) by mouth every 6 (six) hours as needed for muscle spasms. (Patient not taking: Reported on 05/04/2023) 40 tablet 1   oxyCODONE (OXY IR/ROXICODONE) 5 MG immediate release tablet Take 1-2 tablets (5-10 mg total) by mouth every 6 (six) hours as needed for moderate pain (pain score 4-6). 30 tablet 0   No current facility-administered medications on file prior to visit.   Allergies  Allergen Reactions   Mobic [Meloxicam] Diarrhea   Social History   Socioeconomic History   Marital status: Married    Spouse name: Tresa Endo   Number of children: 2   Years of education: Not on file   Highest education level: Not on file  Occupational History   Not on file  Tobacco Use   Smoking status: Never   Smokeless tobacco: Never  Vaping Use  Vaping status: Never Used  Substance and Sexual Activity   Alcohol use: No   Drug use: No   Sexual activity: Not Currently  Other Topics Concern   Not on file  Social History Narrative   Married x 48 years in 2022.   1 grandchild age 37   Social Drivers of Health   Financial Resource Strain: Low Risk  (04/14/2022)   Overall Financial Resource Strain (CARDIA)    Difficulty of Paying Living Expenses: Not hard at all  Food Insecurity: No Food Insecurity (04/14/2022)   Hunger Vital Sign    Worried About Running Out of Food in the Last Year: Never true    Ran Out of Food in the Last Year: Never true  Transportation Needs: No Transportation Needs (04/01/2021)   PRAPARE - Administrator, Civil Service (Medical): No    Lack of  Transportation (Non-Medical): No  Physical Activity: Sufficiently Active (04/14/2022)   Exercise Vital Sign    Days of Exercise per Week: 5 days    Minutes of Exercise per Session: 30 min  Stress: No Stress Concern Present (04/14/2022)   [employer] of Occupational Health - Occupational Stress Questionnaire    Feeling of Stress : Only a little  Social Connections: Socially Integrated (04/14/2022)   Social Connection and Isolation Panel [NHANES]    Frequency of Communication with Friends and Family: More than three times a week    Frequency of Social Gatherings with Friends and Family: More than three times a week    Attends Religious Services: 1 to 4 times per year    Active Member of [employer] or Organizations: Yes    Attends Banker Meetings: 1 to 4 times per year    Marital Status: Married  Catering manager Violence: Not At Risk (04/14/2022)   Humiliation, Afraid, Rape, and Kick questionnaire    Fear of Current or Ex-Partner: No    Emotionally Abused: No    Physically Abused: No    Sexually Abused: No   Family History  Problem Relation Age of Onset   Heart disease Mother    Hypertension Mother    Arthritis Mother    Heart disease Father       Review of Systems  All other systems reviewed and are negative.      Objective:   Physical Exam Vitals reviewed.  Constitutional:      General: She is not in acute distress.    Appearance: Normal appearance. She is well-developed. She is not ill-appearing, toxic-appearing or diaphoretic.  HENT:     Head: Normocephalic and atraumatic.     Right Ear: Tympanic membrane, ear canal and external ear normal.     Left Ear: Tympanic membrane, ear canal and external ear normal.     Nose: Congestion and rhinorrhea present.     Right Sinus: Maxillary sinus tenderness and frontal sinus tenderness present.     Mouth/Throat:     Pharynx: No oropharyngeal exudate or posterior oropharyngeal erythema.  Eyes:     General: No  scleral icterus.       Right eye: No discharge.        Left eye: No discharge.     Conjunctiva/sclera: Conjunctivae normal.     Pupils: Pupils are equal, round, and reactive to light.  Neck:     Thyroid: No thyromegaly.     Vascular: No JVD.     Trachea: No tracheal deviation.  Cardiovascular:     Rate and Rhythm: Normal  rate and regular rhythm.     Heart sounds: Normal heart sounds. No murmur heard.    No friction rub. No gallop.  Pulmonary:     Effort: Pulmonary effort is normal. No respiratory distress.     Breath sounds: Normal breath sounds. No stridor. No wheezing or rales.  Chest:     Chest wall: No tenderness.  Abdominal:     General: Abdomen is flat. There is no distension.     Palpations: Abdomen is soft.     Tenderness: There is no guarding or rebound.  Musculoskeletal:     Cervical back: Normal range of motion and neck supple.     Right hip: Tenderness present. Decreased range of motion.  Lymphadenopathy:     Cervical: No cervical adenopathy.  Skin:    General: Skin is warm.     Findings: No rash.  Neurological:     Mental Status: She is alert and oriented to person, place, and time.     Cranial Nerves: No cranial nerve deficit.     Motor: No abnormal muscle tone.     Coordination: Coordination normal.     Deep Tendon Reflexes: Reflexes are normal and symmetric.           Assessment & Plan:  Rhinosinusitis Patient has an upper respiratory infection but I believe she has developed a secondary bacterial sinus infection.  Begin amoxicillin 875 mg twice daily for 10 days

## 2023-06-11 ENCOUNTER — Ambulatory Visit (HOSPITAL_BASED_OUTPATIENT_CLINIC_OR_DEPARTMENT_OTHER)
Admission: RE | Admit: 2023-06-11 | Discharge: 2023-06-11 | Disposition: A | Discharge status: Home / Self Care | Payer: Medicare PPO | Source: Ambulatory Visit | Attending: Family Medicine | Admitting: Family Medicine

## 2023-06-11 DIAGNOSIS — Z780 Asymptomatic menopausal state: Secondary | ICD-10-CM | POA: Diagnosis not present

## 2023-06-11 DIAGNOSIS — M85852 Other specified disorders of bone density and structure, left thigh: Secondary | ICD-10-CM | POA: Diagnosis not present

## 2023-06-13 DIAGNOSIS — Z1231 Encounter for screening mammogram for malignant neoplasm of breast: Secondary | ICD-10-CM | POA: Diagnosis not present

## 2023-06-13 DIAGNOSIS — Z1331 Encounter for screening for depression: Secondary | ICD-10-CM | POA: Diagnosis not present

## 2023-06-13 DIAGNOSIS — Z01419 Encounter for gynecological examination (general) (routine) without abnormal findings: Secondary | ICD-10-CM | POA: Diagnosis not present

## 2023-06-13 LAB — HM MAMMOGRAPHY

## 2023-06-28 ENCOUNTER — Other Ambulatory Visit: Payer: Self-pay

## 2023-08-20 ENCOUNTER — Other Ambulatory Visit: Payer: Self-pay | Admitting: Family Medicine

## 2023-08-21 NOTE — Telephone Encounter (Signed)
 Requested Prescriptions  Pending Prescriptions Disp Refills   hydrochlorothiazide (HYDRODIURIL) 25 MG tablet [Pharmacy Med Name: hydroCHLOROthiazide 25 MG Oral Tablet] 90 tablet 0    Sig: Take 1 tablet by mouth once daily     Cardiovascular: Diuretics - Thiazide Passed - 08/21/2023  1:22 PM      Passed - Cr in normal range and within 180 days    Creat  Date Value Ref Range Status  05/01/2023 0.81 0.60 - 1.00 mg/dL Final         Passed - K in normal range and within 180 days    Potassium  Date Value Ref Range Status  05/01/2023 4.6 3.5 - 5.3 mmol/L Final         Passed - Na in normal range and within 180 days    Sodium  Date Value Ref Range Status  05/01/2023 140 135 - 146 mmol/L Final         Passed - Last BP in normal range    BP Readings from Last 1 Encounters:  06/07/23 124/76         Passed - Valid encounter within last 6 months    Recent Outpatient Visits           2 months ago Rhinosinusitis   [place] [facility] Donita Brooks, MD   3 months ago Postmenopausal estrogen deficiency   [place] [facility] [name], [name], MD   1 year ago Benign essential HTN   [place] [facility] [name], [name], MD   1 year ago Diverticulosis   [place] [facility] [place] Meo, FNP

## 2023-08-24 ENCOUNTER — Other Ambulatory Visit: Payer: Self-pay | Admitting: Family Medicine

## 2023-08-24 NOTE — Telephone Encounter (Signed)
 Requested Prescriptions  Pending Prescriptions Disp Refills   fluticasone (FLONASE) 50 MCG/ACT nasal spray [Pharmacy Med Name: Fluticasone Propionate 50 MCG/ACT Nasal Suspension] 48 g 0    Sig: Use 2 spray(s) in each nostril once daily     Ear, Nose, and Throat: Nasal Preparations - Corticosteroids Passed - 08/24/2023  3:34 PM      Passed - Valid encounter within last 12 months    Recent Outpatient Visits           2 months ago Rhinosinusitis   [place] [facility] Donita Brooks, MD   3 months ago Postmenopausal estrogen deficiency   [place] [facility] [name], [name], MD   1 year ago Benign essential HTN   [place] [facility] [name], [name], MD   1 year ago Diverticulosis    [facility] [place] [facility] [place] Me[place], FNP

## 2023-08-27 ENCOUNTER — Ambulatory Visit: Admitting: Family Medicine

## 2023-08-27 VITALS — BP 124/76 | Ht 64.0 in | Wt 154.0 lb

## 2023-08-27 DIAGNOSIS — Z0000 Encounter for general adult medical examination without abnormal findings: Secondary | ICD-10-CM

## 2023-08-27 NOTE — Progress Notes (Signed)
 Subjective:   Amy Dennis is a 73 y.o. female who presents for Medicare Annual (Subsequent) preventive examination.  Visit Complete: Virtual I connected with  Amy Dennis on 08/27/23 by a audio enabled telemedicine application and verified that I am speaking with the correct person using two identifiers.  Patient Location: Home  Provider Location: Home Office  I discussed the limitations of evaluation and management by telemedicine. The patient expressed understanding and agreed to proceed.  Vital Signs: Because this visit was a virtual/telehealth visit, some criteria may be missing or patient reported. Any vitals not documented were not able to be obtained and vitals that have been documented are patient reported.  Patient Medicare AWV questionnaire was completed by the patient on 08/27/2023; I have confirmed that all information answered by patient is correct and no changes since this date.  Cardiac Risk Factors include: advanced age (>57men, >10 women);hypertension;dyslipidemia     Objective:    Today's Vitals   08/27/23 1341  BP: 124/76  Weight: 154 lb (69.9 kg)  Height: 5\' 4"  (1.626 m)   Body mass index is 26.43 kg/m.     08/27/2023    2:40 PM 08/30/2022    2:38 PM 04/14/2022    9:15 AM 04/01/2021    9:13 AM 03/18/2020    9:33 AM  Advanced Directives  Does Patient Have a Medical Advance Directive? Yes Yes Yes Yes Yes  Type of Estate agent of [place];Living will Living will;Healthcare Power of [employer] Power of [place];Living will Healthcare Power of [place];Living will Healthcare Power of [place];Living will;Out of facility DNR (pink MOST or yellow form)  Does patient want to make changes to medical advance directive? No - Patient declined      Copy of Healthcare Power of Attorney in Chart? No - copy requested No - copy requested  No - copy requested   Would patient like information on creating a medical advance directive?    No  - Patient declined     Current Medications (verified) Outpatient Encounter Medications as of 08/27/2023  Medication Sig   ALPRAZolam (XANAX) 0.5 MG tablet Take 1 tablet (0.5 mg total) by mouth at bedtime as needed for sleep.   atorvastatin (LIPITOR) 40 MG tablet Take 1 tablet (40 mg total) by mouth daily.   CALCIUM-VITAMIN D PO Take 1 tablet by mouth in the morning.   carboxymethylcellulose (REFRESH PLUS) 0.5 % SOLN Place 1 drop into both eyes daily as needed (dry/irritated eyes.).   cholecalciferol 25 MCG (1000 UT) tablet Take 1,000 Units by mouth in the morning.   cyanocobalamin (VITAMIN B12) 500 MCG tablet Take 1,000 mcg by mouth daily.   fluticasone (FLONASE) 50 MCG/ACT nasal spray Use 2 spray(s) in each nostril once daily   hydrochlorothiazide (HYDRODIURIL) 25 MG tablet Take 1 tablet by mouth once daily   Inulin (FIBER CHOICE PREBIOTIC FIBER) 1.5 g CHEW Chew 1.5 g by mouth in the morning.   loratadine (CLARITIN) 10 MG tablet Take 10 mg by mouth daily.   Melatonin 10 MG TABS Take 10 mg by mouth at bedtime as needed (for sleep).   Menthol, Topical Analgesic, (BIOFREEZE EX) Apply 1 Application topically 3 (three) times daily as needed (pain.).   aspirin 81 MG chewable tablet Chew 1 tablet (81 mg total) by mouth 2 (two) times daily. (Patient not taking: Reported on 08/27/2023)   methocarbamol (ROBAXIN) 500 MG tablet Take 1 tablet (500 mg total) by mouth every 6 (six) hours as needed for  muscle spasms. (Patient not taking: Reported on 08/27/2023)   oxyCODONE (OXY IR/ROXICODONE) 5 MG immediate release tablet Take 1-2 tablets (5-10 mg total) by mouth every 6 (six) hours as needed for moderate pain (pain score 4-6).   No facility-administered encounter medications on file as of 08/27/2023.    Allergies (verified) Mobic [meloxicam]   History: Past Medical History:  Diagnosis Date   Allergy    Arthritis    [employer]reesia 03/17/2020   Heart murmur    Hyperlipidemia    Hypertension    Past  Surgical History:  Procedure Laterality Date   CESAREAN SECTION     x 2   TOTAL HIP ARTHROPLASTY Right 09/05/2022   Procedure: RIGHT TOTAL HIP ARTHROPLASTY ANTERIOR APPROACH;  Surgeon: Arnie Lao, MD;  Location: [facility] OR;  Service: Orthopedics;  Laterality: Right;   WISDOM TOOTH EXTRACTION Bilateral    Family History  Problem Relation Age of Onset   Heart disease Mother    Hypertension Mother    Arthritis Mother    Heart disease Father    Social History   Socioeconomic History   Marital status: Married    Spouse name: Amy Dennis   Number of children: 2   Years of education: Not on file   Highest education level: Not on file  Occupational History   Not on file  Tobacco Use   Smoking status: Never   Smokeless tobacco: Never  Vaping Use   Vaping status: Never Used  Substance and Sexual Activity   Alcohol use: No   Drug use: No   Sexual activity: Not Currently  Other Topics Concern   Not on file  Social History Narrative   Married x 48 years in 2022.   1 grandchild age 65   Social Drivers of Health   Financial Resource Strain: Low Risk  (08/27/2023)   Overall Financial Resource Strain (CARDIA)    Difficulty of Paying Living Expenses: Not hard at all  Food Insecurity: No Food Insecurity (08/27/2023)   Hunger Vital Sign    Worried About Running Out of Food in the Last Year: Never true    Ran Out of Food in the Last Year: Never true  Transportation Needs: No Transportation Needs (08/27/2023)   PRAPARE - Administrator, Civil Service (Medical): No    Lack of Transportation (Non-Medical): No  Physical Activity: Sufficiently Active (08/27/2023)   Exercise Vital Sign    Days of Exercise per Week: 7 days    Minutes of Exercise per Session: 50 min  Stress: No Stress Concern Present (08/27/2023)   [employer] of Occupational Health - Occupational Stress Questionnaire    Feeling of Stress : Not at all  Social Connections: Socially Integrated (08/27/2023)    Social Connection and Isolation Panel [NHANES]    Frequency of Communication with Friends and Family: More than three times a week    Frequency of Social Gatherings with Friends and Family: More than three times a week    Attends Religious Services: 1 to 4 times per year    Active Member of [employer] or Organizations: Yes    Attends Engineer, structural: More than 4 times per year    Marital Status: Married    Tobacco Counseling Counseling given: Not Answered   Clinical Intake:  Pre-visit preparation completed: Yes  Pain : No/denies pain     BMI - recorded: 26 Nutritional Status: BMI 25 -29 Overweight Nutritional Risks: None Diabetes: No  How often do you need to  have someone help you when you read instructions, pamphlets, or other written materials from your doctor or pharmacy?: 1 - Never What is the last grade level you completed in school?: masters in elementary ed  Interpreter Needed?: No      Activities of Daily Living    08/27/2023    2:33 PM 08/30/2022    2:44 PM  In your present state of health, do you have any difficulty performing the following activities:  Hearing? 0   Vision? 0   Difficulty concentrating or making decisions? 0   Walking or climbing stairs? 0   Dressing or bathing? 0   Doing errands, shopping? 0 0  Preparing Food and eating ? N   Using the Toilet? N   In the past six months, have you accidently leaked urine? N   Do you have problems with loss of bowel control? N   Managing your Medications? N   Managing your Finances? N   Housekeeping or managing your Housekeeping? N     Patient Care Team: Austine Lefort, MD as PCP - General (Family Medicine) Sheryle Donning, MD as PCP - Cardiology (Cardiology)  Indicate any recent Medical Services you may have received from other than [facility] providers in the past year (date may be approximate).     Assessment:   This is a routine wellness examination for Laurin.  Hearing/Vision  screen No results found.   Goals Addressed             This Visit's Progress    CCM Expected Outcome:  Monitor, Self-Manage and Reduce Symptoms of:       To get rid of some things in her home. Declutter.       Depression Screen    08/27/2023    2:40 PM 08/27/2023    2:39 PM 06/07/2023    8:52 AM 05/04/2023   10:32 AM 04/14/2022    9:14 AM 04/01/2021    9:07 AM 03/18/2020    9:33 AM  PHQ 2/9 Scores  PHQ - 2 Score 0 0 0 0 0 0 0    Fall Risk    08/27/2023    2:42 PM 06/07/2023    8:52 AM 05/04/2023   10:32 AM 04/13/2022   10:11 PM 02/01/2022   11:54 AM  Fall Risk   Falls in the past year? 0 0 0 0 0  Number falls in past yr: 0 0 0  0  Injury with Fall? 0 0 0  0  Risk for fall due to : No Fall Risks No Fall Risks     Follow up Falls evaluation completed Falls prevention discussed       MEDICARE RISK AT HOME: Medicare Risk at Home Any stairs in or around the home?: Yes If so, are there any without handrails?: Yes Home free of loose throw rugs in walkways, pet beds, electrical cords, etc?: Yes Adequate lighting in your home to reduce risk of falls?: Yes Life alert?: No Use of a cane, walker or w/c?: No Grab bars in the bathroom?: Yes Shower chair or bench in shower?: Yes Elevated toilet seat or a handicapped toilet?: No  TIMED UP AND GO:  Was the test performed?  No    Cognitive Function:        08/27/2023    2:44 PM 04/14/2022    9:23 AM 03/18/2020    9:34 AM  6CIT Screen  What Year? 0 points 0 points 0 points  What month? 0 points 0  points 0 points  What time? 0 points 0 points 0 points  Count back from 20 0 points 0 points 0 points  Months in reverse 0 points 0 points 0 points  Repeat phrase 0 points 0 points 0 points  Total Score 0 points 0 points 0 points    Immunizations Immunization History  Administered Date(s) Administered   Fluad Quad(high Dose 65+) 02/25/2019, 02/18/2020, 01/27/2021   Fluad Trivalent(High Dose 65+) 03/14/2023   Influenza  Whole 02/13/2008   Influenza, High Dose Seasonal PF 02/28/2017   Influenza,inj,Quad PF,6+ Mos 03/27/2013, 02/12/2014, 03/02/2015, 02/29/2016, 03/12/2018   PFIZER Comirnaty(Gray Top)Covid-19 Tri-Sucrose Vaccine 12/08/2020   PFIZER(Purple Top)SARS-COV-2 Vaccination 06/07/2019, 06/28/2019, 02/04/2020   Pneumococcal Conjugate-13 04/29/2018   Pneumococcal Polysaccharide-23 09/12/2016   Tdap 08/19/2013   Unspecified SARS-COV-2 Vaccination 03/06/2023   Zoster, Live 07/11/2012    TDAP status: Due, Education has been provided regarding the importance of this vaccine. Advised may receive this vaccine at local pharmacy or Health Dept. Aware to provide a copy of the vaccination record if obtained from local pharmacy or Health Dept. Verbalized acceptance and understanding.  Flu Vaccine status: Up to date  Pneumococcal vaccine status: Up to date  Covid-19 vaccine status: Information provided on how to obtain vaccines.   Qualifies for Shingles Vaccine? Yes   Zostavax completed Yes   Shingrix Completed?: No.    Education has been provided regarding the importance of this vaccine. Patient has been advised to call insurance company to determine out of pocket expense if they have not yet received this vaccine. Advised may also receive vaccine at local pharmacy or Health Dept. Verbalized acceptance and understanding.  Screening Tests Health Maintenance  Topic Date Due   DTaP/Tdap/Td (2 - Td or Tdap) 08/20/2023   Zoster Vaccines- Shingrix (1 of 2) 09/05/2023 (Originally 04/04/2001)   COVID-19 Vaccine (6 - 2024-25 season) 09/04/2023   INFLUENZA VACCINE  12/14/2023   Medicare Annual Wellness (AWV)  08/26/2024   MAMMOGRAM  06/12/2025   Colonoscopy  02/13/2028   Pneumonia Vaccine 29+ Years old  Completed   DEXA SCAN  Completed   HPV VACCINES  Aged Out   Meningococcal B Vaccine  Aged Out   Hepatitis C Screening  Discontinued    Health Maintenance  Health Maintenance Due  Topic Date Due    DTaP/Tdap/Td (2 - Td or Tdap) 08/20/2023    Colorectal cancer screening: Type of screening: Colonoscopy. Completed 02/12/2018. Repeat every 10 years  Mammogram status: Completed 06/13/2023. Repeat every year  Bone Density status: Completed 0127/2025. Results reflect: Bone density results: OSTEOPENIA. Repeat every 2 years.  Lung Cancer Screening: (Low Dose CT Chest recommended if Age 81-80 years, 20 pack-year currently smoking OR have quit w/in 15years.) does not qualify.   Lung Cancer Screening Referral:   Additional Screening:  Hepatitis C Screening: does not qualify; Completed   Vision Screening: Recommended annual ophthalmology exams for early detection of glaucoma and other disorders of the eye. Is the patient up to date with their annual eye exam?  Yes  Who is the provider or what is the name of the office in which the patient attends annual eye exams? Dr Geralyn Knee If pt is not established with a provider, would they like to be referred to a provider to establish care? No .   Dental Screening: Recommended annual dental exams for proper oral hygiene  Diabetic Foot Exam:   Community Resource Referral / Chronic Care Management: CRR required this visit?  No   CCM required this  visit?  No     Plan:     I have personally reviewed and noted the following in the patient's chart:   Medical and social history Use of alcohol, tobacco or illicit drugs  Current medications and supplements including opioid prescriptions. Patient is currently taking opioid prescriptions. Information provided to patient regarding non-opioid alternatives. Patient advised to discuss non-opioid treatment plan with their provider. Functional ability and status Nutritional status Physical activity Advanced directives List of other physicians Hospitalizations, surgeries, and ER visits in previous 12 months Vitals Screenings to include cognitive, depression, and falls Referrals and appointments  In  addition, I have reviewed and discussed with patient certain preventive protocols, quality metrics, and best practice recommendations. A written personalized care plan for preventive services as well as general preventive health recommendations were provided to patient.     Alvina Axon   08/27/2023   After Visit Summary: (MyChart) Due to this being a telephonic visit, the after visit summary with patients personalized plan was offered to patient via MyChart   Nurse Notes:    Ms. Lemme , Thank you for taking time to come for your Medicare Wellness Visit. I appreciate your ongoing commitment to your health goals. Please review the following plan we discussed and let me know if I can assist you in the future.   These are the goals we discussed:  Goals      CCM Expected Outcome:  Monitor, Self-Manage and Reduce Symptoms of:     To get rid of some things in her home. Declutter.      Exercise 3x per week (30 min per time)     Continue to exercise and maintain health.     Weight (lb) < 200 lb (90.7 kg)     Loose 10 lbs/continue to be active and walk 10,000 steps/being positive        This is a list of the screening recommended for you and due dates:  Health Maintenance  Topic Date Due   DTaP/Tdap/Td vaccine (2 - Td or Tdap) 08/20/2023   Zoster (Shingles) Vaccine (1 of 2) 09/05/2023*   COVID-19 Vaccine (6 - 2024-25 season) 09/04/2023   Flu Shot  12/14/2023   Medicare Annual Wellness Visit  08/26/2024   Mammogram  06/12/2025   Colon Cancer Screening  02/13/2028   Pneumonia Vaccine  Completed   DEXA scan (bone density measurement)  Completed   HPV Vaccine  Aged Out   Meningitis B Vaccine  Aged Out   Hepatitis C Screening  Discontinued  *Topic was postponed. The date shown is not the original due date.

## 2023-08-27 NOTE — Patient Instructions (Signed)

## 2023-09-13 DIAGNOSIS — H524 Presbyopia: Secondary | ICD-10-CM | POA: Diagnosis not present

## 2023-09-19 DIAGNOSIS — D225 Melanocytic nevi of trunk: Secondary | ICD-10-CM | POA: Diagnosis not present

## 2023-09-19 DIAGNOSIS — L814 Other melanin hyperpigmentation: Secondary | ICD-10-CM | POA: Diagnosis not present

## 2023-09-19 DIAGNOSIS — L821 Other seborrheic keratosis: Secondary | ICD-10-CM | POA: Diagnosis not present

## 2023-09-19 DIAGNOSIS — L570 Actinic keratosis: Secondary | ICD-10-CM | POA: Diagnosis not present

## 2023-09-19 DIAGNOSIS — D0471 Carcinoma in situ of skin of right lower limb, including hip: Secondary | ICD-10-CM | POA: Diagnosis not present

## 2023-09-19 DIAGNOSIS — Z85828 Personal history of other malignant neoplasm of skin: Secondary | ICD-10-CM | POA: Diagnosis not present

## 2023-10-11 ENCOUNTER — Telehealth: Payer: Self-pay

## 2023-10-11 NOTE — Telephone Encounter (Signed)
 Second attempt to contact pt. Not successful. Lvm for call back.

## 2023-10-11 NOTE — Telephone Encounter (Signed)
 Copied from CRM [id number]. Topic: Clinical - Lab/Test Results >> Oct 11, 2023  9:48 AM Lotus [place] B wrote: Reason for CRM: pt called in because she was told that she needs to re-take her labs from December . If there is a way a order can be put in so she can re-take those labs .

## 2023-10-15 ENCOUNTER — Other Ambulatory Visit

## 2023-10-15 DIAGNOSIS — E7800 Pure hypercholesterolemia, unspecified: Secondary | ICD-10-CM

## 2023-10-15 DIAGNOSIS — I700 Atherosclerosis of aorta: Secondary | ICD-10-CM | POA: Diagnosis not present

## 2023-10-15 DIAGNOSIS — I10 Essential (primary) hypertension: Secondary | ICD-10-CM | POA: Diagnosis not present

## 2023-10-16 ENCOUNTER — Ambulatory Visit: Payer: Self-pay | Admitting: Family Medicine

## 2023-10-16 LAB — COMPLETE METABOLIC PANEL WITHOUT GFR
AG Ratio: 1.5 (calc) (ref 1.0–2.5)
ALT: 24 U/L (ref 6–29)
AST: 25 U/L (ref 10–35)
Albumin: 4.2 g/dL (ref 3.6–5.1)
Alkaline phosphatase (APISO): 90 U/L (ref 37–153)
BUN: 15 mg/dL (ref 7–25)
CO2: 31 mmol/L (ref 20–32)
Calcium: 9.2 mg/dL (ref 8.6–10.4)
Chloride: 101 mmol/L (ref 98–110)
Creat: 0.8 mg/dL (ref 0.60–1.00)
Globulin: 2.8 g/dL (ref 1.9–3.7)
Glucose, Bld: 97 mg/dL (ref 65–99)
Potassium: 4.2 mmol/L (ref 3.5–5.3)
Sodium: 140 mmol/L (ref 135–146)
Total Bilirubin: 0.5 mg/dL (ref 0.2–1.2)
Total Protein: 7 g/dL (ref 6.1–8.1)

## 2023-10-16 LAB — CBC WITH DIFFERENTIAL/PLATELET
Absolute Lymphocytes: 1320 {cells}/uL (ref 850–3900)
Absolute Monocytes: 486 {cells}/uL (ref 200–950)
Basophils Absolute: 78 {cells}/uL (ref 0–200)
Basophils Relative: 1.3 %
Eosinophils Absolute: 210 {cells}/uL (ref 15–500)
Eosinophils Relative: 3.5 %
HCT: 39.4 % (ref 35.0–45.0)
Hemoglobin: 12.5 g/dL (ref 11.7–15.5)
MCH: 27.6 pg (ref 27.0–33.0)
MCHC: 31.7 g/dL — ABNORMAL LOW (ref 32.0–36.0)
MCV: 87 fL (ref 80.0–100.0)
MPV: 13.3 fL — ABNORMAL HIGH (ref 7.5–12.5)
Monocytes Relative: 8.1 %
Neutro Abs: 3906 {cells}/uL (ref 1500–7800)
Neutrophils Relative %: 65.1 %
Platelets: 192 10*3/uL (ref 140–400)
RBC: 4.53 10*6/uL (ref 3.80–5.10)
RDW: 14.2 % (ref 11.0–15.0)
Total Lymphocyte: 22 %
WBC: 6 10*3/uL (ref 3.8–10.8)

## 2023-10-16 LAB — LIPID PANEL
Cholesterol: 167 mg/dL (ref <200)
HDL: 68 mg/dL (ref >=50)
LDL Cholesterol (Calc): 82 mg/dL
Non-HDL Cholesterol (Calc): 99 mg/dL (ref <130)
Total CHOL/HDL Ratio: 2.5 (calc) (ref <5.0)
Triglycerides: 87 mg/dL (ref <150)

## 2023-11-15 ENCOUNTER — Other Ambulatory Visit: Payer: Self-pay | Admitting: Family Medicine

## 2023-11-24 ENCOUNTER — Telehealth: Payer: Self-pay | Admitting: *Deleted

## 2023-11-24 NOTE — Telephone Encounter (Signed)
(  Late entry for 09/18/23 1 year call)- Ortho bundle 1 year call completed.

## 2023-12-19 ENCOUNTER — Other Ambulatory Visit: Payer: Self-pay | Admitting: Family Medicine

## 2023-12-25 ENCOUNTER — Ambulatory Visit: Admitting: Family Medicine

## 2023-12-25 ENCOUNTER — Encounter: Payer: Self-pay | Admitting: Family Medicine

## 2023-12-25 VITALS — BP 120/68 | HR 74 | Temp 98.1°F | Ht 64.0 in | Wt 150.2 lb

## 2023-12-25 DIAGNOSIS — E7800 Pure hypercholesterolemia, unspecified: Secondary | ICD-10-CM

## 2023-12-25 DIAGNOSIS — I83893 Varicose veins of bilateral lower extremities with other complications: Secondary | ICD-10-CM | POA: Diagnosis not present

## 2023-12-25 DIAGNOSIS — I10 Essential (primary) hypertension: Secondary | ICD-10-CM | POA: Diagnosis not present

## 2023-12-25 MED ORDER — ALPRAZOLAM 0.5 MG PO TABS: 0.5000 mg | ORAL_TABLET | Freq: Every evening | ORAL | 0 refills | Status: AC | PRN

## 2023-12-25 MED ORDER — ATORVASTATIN CALCIUM 20 MG PO TABS: 40.0000 mg | ORAL_TABLET | Freq: Every day | ORAL | 3 refills | Status: AC

## 2023-12-25 MED ORDER — HYDROCHLOROTHIAZIDE 25 MG PO TABS: 25.0000 mg | ORAL_TABLET | Freq: Every day | ORAL | 0 refills | Status: DC

## 2023-12-25 NOTE — Progress Notes (Signed)
 Subjective:    Patient ID: Amy Dennis, female    DOB: 15-Jun-1950, 73 y.o.   MRN: [id number]   Patient is here today for her regular checkup.  Her blood pressure is excellent at 120 over 68.  She denies any chest pain or shortness of breath or dyspnea on exertion.  She has had her second shingles vaccine.  Her mammogram and colonoscopy are up-to-date.  Her most recent lab work is listed below.  She is using Xanax .  30 tablets lasted her 9 months.  She is averaging 3-4 Xanax  tablets per month use sparingly for anxiety.  She denies any falls or dementia or memory loss or confusion No visits with results within 1 Month(s) from this visit.  Latest known visit with results is:  Lab on 10/15/2023  Component Date Value Ref Range Status   WBC 10/15/2023 6.0  3.8 - 10.8 Thousand/uL Final   RBC 10/15/2023 4.53  3.80 - 5.10 Million/uL Final   Hemoglobin 10/15/2023 12.5  11.7 - 15.5 g/dL Final   HCT 93/97/7974 39.4  35.0 - 45.0 % Final   MCV 10/15/2023 87.0  80.0 - 100.0 fL Final   MCH 10/15/2023 27.6  27.0 - 33.0 pg Final   MCHC 10/15/2023 31.7 (L)  32.0 - 36.0 g/dL Final   Comment: For adults, a slight decrease in the calculated MCHC value (in the range of 30 to 32 g/dL) is most likely not clinically significant; however, it should be interpreted with caution in correlation with other red cell parameters and the patient's clinical condition.    RDW 10/15/2023 14.2  11.0 - 15.0 % Final   Platelets 10/15/2023 192  140 - 400 Thousand/uL Final   MPV 10/15/2023 13.3 (H)  7.5 - 12.5 fL Final   Neutro Abs 10/15/2023 3,906  1,500 - 7,800 cells/uL Final   Absolute Lymphocytes 10/15/2023 1,320  850 - 3,900 cells/uL Final   Absolute Monocytes 10/15/2023 486  200 - 950 cells/uL Final   Eosinophils Absolute 10/15/2023 210  15 - 500 cells/uL Final   Basophils Absolute 10/15/2023 78  0 - 200 cells/uL Final   Neutrophils Relative % 10/15/2023 65.1  % Final   Total Lymphocyte 10/15/2023 22.0  % Final    Monocytes Relative 10/15/2023 8.1  % Final   Eosinophils Relative 10/15/2023 3.5  % Final   Basophils Relative 10/15/2023 1.3  % Final   Glucose, Bld 10/15/2023 97  65 - 99 mg/dL Final   Comment: .            Fasting reference interval .    BUN 10/15/2023 15  7 - 25 mg/dL Final   Creat 93/97/7974 0.80  0.60 - 1.00 mg/dL Final   BUN/Creatinine Ratio 10/15/2023 SEE NOTE:  6 - 22 (calc) Final   Comment:    Not Reported: BUN and Creatinine are within    reference range. .    Sodium 10/15/2023 140  135 - 146 mmol/L Final   Potassium 10/15/2023 4.2  3.5 - 5.3 mmol/L Final   Chloride 10/15/2023 101  98 - 110 mmol/L Final   CO2 10/15/2023 31  20 - 32 mmol/L Final   Calcium  10/15/2023 9.2  8.6 - 10.4 mg/dL Final   Total Protein 93/97/7974 7.0  6.1 - 8.1 g/dL Final   Albumin 93/97/7974 4.2  3.6 - 5.1 g/dL Final   Globulin 93/97/7974 2.8  1.9 - 3.7 g/dL (calc) Final   AG Ratio 10/15/2023 1.5  1.0 - 2.5 (calc) Final  Total Bilirubin 10/15/2023 0.5  0.2 - 1.2 mg/dL Final   Alkaline phosphatase (APISO) 10/15/2023 90  37 - 153 U/L Final   AST 10/15/2023 25  10 - 35 U/L Final   ALT 10/15/2023 24  6 - 29 U/L Final   Cholesterol 10/15/2023 167  <200 mg/dL Final   HDL 93/97/7974 68  > OR = 50 mg/dL Final   Triglycerides 93/97/7974 87  <150 mg/dL Final   LDL Cholesterol (Calc) 10/15/2023 82  mg/dL (calc) Final   Comment: Reference range: <100 . Desirable range <100 mg/dL for primary prevention;   <70 mg/dL for patients with CHD or diabetic patients  with > or = 2 CHD risk factors. SABRA LDL-C is now calculated using the Martin-Hopkins  calculation, which is a validated novel method providing  better accuracy than the Friedewald equation in the  estimation of LDL-C.  Gladis APPLETHWAITE et al. SANDREA. 7986;689(80): 2061-2068  (http://education.[facility].com/faq/FAQ164)    Total CHOL/HDL Ratio 10/15/2023 2.5  <4.9 (calc) Final   Non-HDL Cholesterol (Calc) 10/15/2023 99  <130 mg/dL (calc) Final    Comment: For patients with diabetes plus 1 major ASCVD risk  factor, treating to a non-HDL-C goal of <100 mg/dL  (LDL-C of <29 mg/dL) is considered a therapeutic  option.     Past Medical History:  Diagnosis Date   Allergy    Arthritis    Phreesia 03/17/2020   Heart murmur    Hyperlipidemia    Hypertension    Past Surgical History:  Procedure Laterality Date   CESAREAN SECTION     x 2   TOTAL HIP ARTHROPLASTY Right 09/05/2022   Procedure: RIGHT TOTAL HIP ARTHROPLASTY ANTERIOR APPROACH;  Surgeon: Vernetta Lonni GRADE, MD;  Location: [facility] OR;  Service: Orthopedics;  Laterality: Right;   WISDOM TOOTH EXTRACTION Bilateral    Current Outpatient Medications on File Prior to Visit  Medication Sig Dispense Refill   ALPRAZolam  (XANAX ) 0.5 MG tablet Take 1 tablet (0.5 mg total) by mouth at bedtime as needed for sleep. 30 tablet 0   aspirin  81 MG chewable tablet Chew 1 tablet (81 mg total) by mouth 2 (two) times daily. (Patient not taking: Reported on 08/27/2023) 35 tablet 0   atorvastatin  (LIPITOR) 40 MG tablet Take 1 tablet (40 mg total) by mouth daily. 90 tablet 3   CALCIUM -VITAMIN D  PO Take 1 tablet by mouth in the morning.     carboxymethylcellulose (REFRESH PLUS) 0.5 % SOLN Place 1 drop into both eyes daily as needed (dry/irritated eyes.).     cholecalciferol  25 MCG (1000 UT) tablet Take 1,000 Units by mouth in the morning.     cyanocobalamin (VITAMIN B12) 500 MCG tablet Take 1,000 mcg by mouth daily.     fluticasone  (FLONASE ) 50 MCG/ACT nasal spray Use 2 spray(s) in each nostril once daily 48 g 0   hydrochlorothiazide  (HYDRODIURIL ) 25 MG tablet Take 1 tablet by mouth once daily 90 tablet 0   Inulin (FIBER CHOICE PREBIOTIC FIBER) 1.5 g CHEW Chew 1.5 g by mouth in the morning.     loratadine  (CLARITIN ) 10 MG tablet Take 10 mg by mouth daily.     Melatonin 10 MG TABS Take 10 mg by mouth at bedtime as needed (for sleep).     Menthol , Topical Analgesic, (BIOFREEZE EX) Apply 1 Application  topically 3 (three) times daily as needed (pain.).     methocarbamol  (ROBAXIN ) 500 MG tablet Take 1 tablet (500 mg total) by mouth every 6 (six) hours as needed for  muscle spasms. (Patient not taking: Reported on 08/27/2023) 40 tablet 1   oxyCODONE  (OXY IR/ROXICODONE ) 5 MG immediate release tablet Take 1-2 tablets (5-10 mg total) by mouth every 6 (six) hours as needed for moderate pain (pain score 4-6). 30 tablet 0   No current facility-administered medications on file prior to visit.   Allergies  Allergen Reactions   Mobic [Meloxicam] Diarrhea   Social History   Socioeconomic History   Marital status: Married    Spouse name: Burnard   Number of children: 2   Years of education: Not on file   Highest education level: Not on file  Occupational History   Not on file  Tobacco Use   Smoking status: Never   Smokeless tobacco: Never  Vaping Use   Vaping status: Never Used  Substance and Sexual Activity   Alcohol  use: No   Drug use: No   Sexual activity: Not Currently  Other Topics Concern   Not on file  Social History Narrative   Married x 48 years in 2022.   1 grandchild age 49   Social Drivers of Health   Financial Resource Strain: Low Risk  (08/27/2023)   Overall Financial Resource Strain (CARDIA)    Difficulty of Paying Living Expenses: Not hard at all  Food Insecurity: No Food Insecurity (08/27/2023)   Hunger Vital Sign    Worried About Running Out of Food in the Last Year: Never true    Ran Out of Food in the Last Year: Never true  Transportation Needs: No Transportation Needs (08/27/2023)   PRAPARE - Administrator, Civil Service (Medical): No    Lack of Transportation (Non-Medical): No  Physical Activity: Sufficiently Active (08/27/2023)   Exercise Vital Sign    Days of Exercise per Week: 7 days    Minutes of Exercise per Session: 50 min  Stress: No Stress Concern Present (08/27/2023)   [employer] of Occupational Health - Occupational Stress  Questionnaire    Feeling of Stress : Not at all  Social Connections: Socially Integrated (08/27/2023)   Social Connection and Isolation Panel    Frequency of Communication with Friends and Family: More than three times a week    Frequency of Social Gatherings with Friends and Family: More than three times a week    Attends Religious Services: 1 to 4 times per year    Active Member of [employer] or Organizations: Yes    Attends Engineer, structural: More than 4 times per year    Marital Status: Married  Catering manager Violence: Not At Risk (08/27/2023)   Humiliation, Afraid, Rape, and Kick questionnaire    Fear of Current or Ex-Partner: No    Emotionally Abused: No    Physically Abused: No    Sexually Abused: No   Family History  Problem Relation Age of Onset   Heart disease Mother    Hypertension Mother    Arthritis Mother    Heart disease Father       Review of Systems  All other systems reviewed and are negative.      Objective:   Physical Exam Vitals reviewed.  Constitutional:      General: She is not in acute distress.    Appearance: Normal appearance. She is well-developed. She is not ill-appearing, toxic-appearing or diaphoretic.  HENT:     Head: Normocephalic and atraumatic.     Mouth/Throat:     Pharynx: No oropharyngeal exudate or posterior oropharyngeal erythema.  Eyes:  General: No scleral icterus.       Right eye: No discharge.        Left eye: No discharge.     Conjunctiva/sclera: Conjunctivae normal.     Pupils: Pupils are equal, round, and reactive to light.  Neck:     Thyroid: No thyromegaly.     Vascular: No JVD.     Trachea: No tracheal deviation.  Cardiovascular:     Rate and Rhythm: Normal rate and regular rhythm.     Heart sounds: Normal heart sounds. No murmur heard.    No friction rub. No gallop.  Pulmonary:     Effort: Pulmonary effort is normal. No respiratory distress.     Breath sounds: Normal breath sounds. No stridor. No  wheezing or rales.  Chest:     Chest wall: No tenderness.  Abdominal:     General: Abdomen is flat. There is no distension.     Palpations: Abdomen is soft.     Tenderness: There is no guarding or rebound.  Musculoskeletal:     Cervical back: Normal range of motion and neck supple.  Lymphadenopathy:     Cervical: No cervical adenopathy.  Skin:    General: Skin is warm.     Findings: No rash.  Neurological:     Mental Status: She is alert and oriented to person, place, and time.     Cranial Nerves: No cranial nerve deficit.     Motor: No abnormal muscle tone.     Coordination: Coordination normal.     Deep Tendon Reflexes: Reflexes are normal and symmetric.           Assessment & Plan:  Benign essential HTN - Plan: hydrochlorothiazide  (HYDRODIURIL ) 25 MG tablet  Pure hypercholesterolemia - Plan: atorvastatin  (LIPITOR) 20 MG tablet  Varicose veins of both legs with edema - Plan: Ambulatory referral to Vascular Surgery Patient's blood pressure today is excellent.  Her lab work is outstanding.  Her coronary artery calcium  score is 0.  I feel that we can reduce her Lipitor to 20 mg a day safely.  I will refill her hydrochlorothiazide .  Also refilled her Xanax  which she uses sparingly.  She does have a large varicose vein streaking across the anterior left shin to the lateral aspect of the left shin.  This causes her occasional pain, swelling, and also difficulty when she shaves her legs.  She would be interested in seeing a vascular specialist to discuss treatment options.

## 2023-12-26 ENCOUNTER — Encounter: Payer: Self-pay | Admitting: Family Medicine

## 2024-01-08 DIAGNOSIS — H18413 Arcus senilis, bilateral: Secondary | ICD-10-CM | POA: Diagnosis not present

## 2024-01-08 DIAGNOSIS — H2511 Age-related nuclear cataract, right eye: Secondary | ICD-10-CM | POA: Diagnosis not present

## 2024-01-08 DIAGNOSIS — H2513 Age-related nuclear cataract, bilateral: Secondary | ICD-10-CM | POA: Diagnosis not present

## 2024-01-08 DIAGNOSIS — H25043 Posterior subcapsular polar age-related cataract, bilateral: Secondary | ICD-10-CM | POA: Diagnosis not present

## 2024-01-08 DIAGNOSIS — H25013 Cortical age-related cataract, bilateral: Secondary | ICD-10-CM | POA: Diagnosis not present

## 2024-01-17 ENCOUNTER — Other Ambulatory Visit (INDEPENDENT_AMBULATORY_CARE_PROVIDER_SITE_OTHER): Payer: Self-pay | Admitting: Vascular Surgery

## 2024-01-17 DIAGNOSIS — I83893 Varicose veins of bilateral lower extremities with other complications: Secondary | ICD-10-CM

## 2024-01-21 ENCOUNTER — Encounter (INDEPENDENT_AMBULATORY_CARE_PROVIDER_SITE_OTHER): Payer: Self-pay | Admitting: Nurse Practitioner

## 2024-01-21 ENCOUNTER — Ambulatory Visit (INDEPENDENT_AMBULATORY_CARE_PROVIDER_SITE_OTHER): Admitting: Nurse Practitioner

## 2024-01-21 ENCOUNTER — Ambulatory Visit (INDEPENDENT_AMBULATORY_CARE_PROVIDER_SITE_OTHER)

## 2024-01-21 VITALS — BP 124/74 | HR 69 | Temp 98.2°F | Resp 16 | Ht 64.0 in | Wt 153.0 lb

## 2024-01-21 DIAGNOSIS — I10 Essential (primary) hypertension: Secondary | ICD-10-CM

## 2024-01-21 DIAGNOSIS — I8311 Varicose veins of right lower extremity with inflammation: Secondary | ICD-10-CM | POA: Diagnosis not present

## 2024-01-21 DIAGNOSIS — I8312 Varicose veins of left lower extremity with inflammation: Secondary | ICD-10-CM

## 2024-01-21 DIAGNOSIS — I83893 Varicose veins of bilateral lower extremities with other complications: Secondary | ICD-10-CM | POA: Diagnosis not present

## 2024-01-24 ENCOUNTER — Other Ambulatory Visit: Payer: Self-pay | Admitting: Family Medicine

## 2024-01-28 ENCOUNTER — Encounter (INDEPENDENT_AMBULATORY_CARE_PROVIDER_SITE_OTHER): Payer: Self-pay | Admitting: Nurse Practitioner

## 2024-01-28 NOTE — Progress Notes (Signed)
 Subjective:    Patient ID: Amy Dennis, female    DOB: [birth year], 73 y.o.   MRN: [id number] Chief Complaint  Patient presents with   Establish Care    The patient is seen for evaluation of symptomatic varicose veins. The patient relates burning and stinging which worsened steadily throughout the course of the day, particularly with standing. The patient also notes an aching and throbbing pain over the varicosities, particularly with prolonged dependent positions. The symptoms are significantly improved with elevation.  The patient also notes that during hot weather the symptoms are greatly intensified. The patient states the pain from the varicose veins interferes with work, daily exercise, shopping and household maintenance. At this point, the symptoms are persistent and severe enough that they're having a negative impact on lifestyle and are interfering with daily activities.  There is no history of DVT, PE or superficial thrombophlebitis. There is no history of ulceration or hemorrhage. The patient denies a significant family history of varicose veins.  The patient has worn graduated compression in the past. At the present time the patient has not been using over-the-counter analgesics. There is no history of prior surgical intervention or sclerotherapy.   Today noninvasive study showed no evidence of DVT or superficial thrombophlebitis bilaterally.  Deep venous insufficiency is noted bilaterally.  There is also superficial venous reflux noted in the bilateral great saphenous veins.    Review of Systems  All other systems reviewed and are negative.      Objective:   Physical Exam Vitals reviewed.  HENT:     Head: Normocephalic.  Cardiovascular:     Rate and Rhythm: Normal rate.     Pulses: Normal pulses.  Pulmonary:     Effort: Pulmonary effort is normal.  Skin:    General: Skin is warm and dry.  Neurological:     Mental Status: She is alert and oriented to person,  place, and time.  Psychiatric:        Mood and Affect: Mood normal.        Behavior: Behavior normal.        Thought Content: Thought content normal.        Judgment: Judgment normal.     BP 124/74   Pulse 69   Ht 5' 4 (1.626 m)   Wt 153 lb (69.4 kg)   BMI 26.26 kg/m   Past Medical History:  Diagnosis Date   Allergy    Arthritis    Phreesia 03/17/2020   Heart murmur    Hyperlipidemia    Hypertension     Social History   Socioeconomic History   Marital status: Married    Spouse name: Burnard   Number of children: 2   Years of education: Not on file   Highest education level: Not on file  Occupational History   Not on file  Tobacco Use   Smoking status: Never   Smokeless tobacco: Never  Vaping Use   Vaping status: Never Used  Substance and Sexual Activity   Alcohol  use: No   Drug use: No   Sexual activity: Not Currently  Other Topics Concern   Not on file  Social History Narrative   Married x 48 years in 2022.   1 grandchild age 19   Social Drivers of Health   Financial Resource Strain: Low Risk  (08/27/2023)   Overall Financial Resource Strain (CARDIA)    Difficulty of Paying Living Expenses: Not hard at all  Food Insecurity: No Food Insecurity (  08/27/2023)   Hunger Vital Sign    Worried About Running Out of Food in the Last Year: Never true    Ran Out of Food in the Last Year: Never true  Transportation Needs: No Transportation Needs (08/27/2023)   PRAPARE - Administrator, Civil Service (Medical): No    Lack of Transportation (Non-Medical): No  Physical Activity: Sufficiently Active (08/27/2023)   Exercise Vital Sign    Days of Exercise per Week: 7 days    Minutes of Exercise per Session: 50 min  Stress: No Stress Concern Present (08/27/2023)   [employer] of Occupational Health - Occupational Stress Questionnaire    Feeling of Stress : Not at all  Social Connections: Socially Integrated (08/27/2023)   Social Connection and Isolation  Panel    Frequency of Communication with Friends and Family: More than three times a week    Frequency of Social Gatherings with Friends and Family: More than three times a week    Attends Religious Services: 1 to 4 times per year    Active Member of Clubs or Organizations: Yes    Attends Banker Meetings: More than 4 times per year    Marital Status: Married  Catering manager Violence: Not At Risk (08/27/2023)   Humiliation, Afraid, Rape, and Kick questionnaire    Fear of Current or Ex-Partner: No    Emotionally Abused: No    Physically Abused: No    Sexually Abused: No    Past Surgical History:  Procedure Laterality Date   CESAREAN SECTION     x 2   TOTAL HIP ARTHROPLASTY Right 09/05/2022   Procedure: RIGHT TOTAL HIP ARTHROPLASTY ANTERIOR APPROACH;  Surgeon: Vernetta Lonni GRADE, MD;  Location: [facility] OR;  Service: Orthopedics;  Laterality: Right;   WISDOM TOOTH EXTRACTION Bilateral     Family History  Problem Relation Age of Onset   Heart disease Mother    Hypertension Mother    Arthritis Mother    Heart disease Father     Allergies  Allergen Reactions   Mobic [Meloxicam] Diarrhea       Latest Ref Rng & Units 10/15/2023    9:21 AM 05/01/2023    8:11 AM 09/06/2022    6:27 AM  CBC  WBC 3.8 - 10.8 Thousand/uL 6.0  5.1  7.7   Hemoglobin 11.7 - 15.5 g/dL 87.4  87.7  9.7   Hematocrit 35.0 - 45.0 % 39.4  37.5  29.3   Platelets 140 - 400 Thousand/uL 192  190  169       CMP     Component Value Date/Time   NA 140 10/15/2023 0921   K 4.2 10/15/2023 0921   CL 101 10/15/2023 0921   CO2 31 10/15/2023 0921   GLUCOSE 97 10/15/2023 0921   BUN 15 10/15/2023 0921   CREATININE 0.80 10/15/2023 0921   CALCIUM  9.2 10/15/2023 0921   PROT 7.0 10/15/2023 0921   ALBUMIN 3.8 08/30/2022 1526   AST 25 10/15/2023 0921   ALT 24 10/15/2023 0921   ALKPHOS 63 08/30/2022 1526   BILITOT 0.5 10/15/2023 0921   EGFR 77 05/01/2023 0811   GFRNONAA >60 09/06/2022 0627   GFRNONAA  77 03/16/2020 0846     No results found.     Assessment & Plan:   1. Varicose veins of both lower extremities with inflammation (Primary) Based upon patient's noninvasive study she would be eligible for bilateral great saphenous vein ablations.  However at this time she would  like to discuss this with her family as well as do additional research which is understandable.  The patient will contact us  if she wishes to move forward with intervention.  We have also discussed the patient that she should continue with medical grade conservative therapy including use of medical grade compression, elevation and activity if she decides not to move forward with intervention.  2. Essential hypertension Continue antihypertensive medications as already ordered, these medications have been reviewed and there are no changes at this time.   Current Outpatient Medications on File Prior to Visit  Medication Sig Dispense Refill   ALPRAZolam  (XANAX ) 0.5 MG tablet Take 1 tablet (0.5 mg total) by mouth at bedtime as needed for sleep. 30 tablet 0   aspirin  81 MG chewable tablet Chew 1 tablet (81 mg total) by mouth 2 (two) times daily. (Patient not taking: Reported on 01/21/2024) 35 tablet 0   atorvastatin  (LIPITOR) 20 MG tablet Take 2 tablets (40 mg total) by mouth daily. 90 tablet 3   CALCIUM -VITAMIN D  PO Take 1 tablet by mouth in the morning.     carboxymethylcellulose (REFRESH PLUS) 0.5 % SOLN Place 1 drop into both eyes daily as needed (dry/irritated eyes.).     cholecalciferol  25 MCG (1000 UT) tablet Take 1,000 Units by mouth in the morning.     cyanocobalamin (VITAMIN B12) 500 MCG tablet Take 1,000 mcg by mouth daily.     hydrochlorothiazide  (HYDRODIURIL ) 25 MG tablet Take 1 tablet (25 mg total) by mouth daily. 90 tablet 0   Inulin (FIBER CHOICE PREBIOTIC FIBER) 1.5 g CHEW Chew 1.5 g by mouth in the morning.     loratadine  (CLARITIN ) 10 MG tablet Take 10 mg by mouth daily.     Melatonin 10 MG TABS Take 10 mg  by mouth at bedtime as needed (for sleep).     Menthol , Topical Analgesic, (BIOFREEZE EX) Apply 1 Application topically 3 (three) times daily as needed (pain.).     methocarbamol  (ROBAXIN ) 500 MG tablet Take 1 tablet (500 mg total) by mouth every 6 (six) hours as needed for muscle spasms. (Patient not taking: Reported on 12/25/2023) 40 tablet 1   oxyCODONE  (OXY IR/ROXICODONE ) 5 MG immediate release tablet Take 1-2 tablets (5-10 mg total) by mouth every 6 (six) hours as needed for moderate pain (pain score 4-6). 30 tablet 0   No current facility-administered medications on file prior to visit.    There are no Patient Instructions on file for this visit. No follow-ups on file.   Mallori Araque E Fernando Torry, NP

## 2024-02-29 ENCOUNTER — Ambulatory Visit (INDEPENDENT_AMBULATORY_CARE_PROVIDER_SITE_OTHER)

## 2024-02-29 DIAGNOSIS — Z23 Encounter for immunization: Secondary | ICD-10-CM | POA: Diagnosis not present

## 2024-02-29 NOTE — Progress Notes (Signed)
 Patient is in office today for a nurse visit for Immunization. Patient Injection was given in the  Left deltoid. Patient tolerated injection well.

## 2024-03-17 ENCOUNTER — Encounter: Payer: Self-pay | Admitting: Radiology

## 2024-03-21 DIAGNOSIS — L821 Other seborrheic keratosis: Secondary | ICD-10-CM | POA: Diagnosis not present

## 2024-03-21 DIAGNOSIS — L570 Actinic keratosis: Secondary | ICD-10-CM | POA: Diagnosis not present

## 2024-03-21 DIAGNOSIS — D0471 Carcinoma in situ of skin of right lower limb, including hip: Secondary | ICD-10-CM | POA: Diagnosis not present

## 2024-03-21 DIAGNOSIS — Z85828 Personal history of other malignant neoplasm of skin: Secondary | ICD-10-CM | POA: Diagnosis not present

## 2024-03-21 DIAGNOSIS — D225 Melanocytic nevi of trunk: Secondary | ICD-10-CM | POA: Diagnosis not present

## 2024-03-21 DIAGNOSIS — L814 Other melanin hyperpigmentation: Secondary | ICD-10-CM | POA: Diagnosis not present

## 2024-03-24 DIAGNOSIS — H2511 Age-related nuclear cataract, right eye: Secondary | ICD-10-CM | POA: Diagnosis not present

## 2024-03-24 DIAGNOSIS — H5371 Glare sensitivity: Secondary | ICD-10-CM | POA: Diagnosis not present

## 2024-03-25 DIAGNOSIS — H2512 Age-related nuclear cataract, left eye: Secondary | ICD-10-CM | POA: Diagnosis not present

## 2024-04-07 DIAGNOSIS — H5371 Glare sensitivity: Secondary | ICD-10-CM | POA: Diagnosis not present

## 2024-04-07 DIAGNOSIS — H2512 Age-related nuclear cataract, left eye: Secondary | ICD-10-CM | POA: Diagnosis not present

## 2024-05-16 ENCOUNTER — Other Ambulatory Visit: Payer: Self-pay | Admitting: Family Medicine

## 2024-05-16 DIAGNOSIS — I10 Essential (primary) hypertension: Secondary | ICD-10-CM

## 2024-06-20 ENCOUNTER — Other Ambulatory Visit: Payer: Self-pay | Admitting: Family Medicine

## 2024-08-14 ENCOUNTER — Ambulatory Visit (HOSPITAL_BASED_OUTPATIENT_CLINIC_OR_DEPARTMENT_OTHER): Admitting: Cardiology
